# Patient Record
Sex: Male | Born: 1990 | Race: White | Hispanic: No | Marital: Single | Smoking: Former smoker
Health system: Southern US, Community
[De-identification: ages and names within clinical notes are randomized; demographics above are authoritative.]

## PROBLEM LIST (undated history)

## (undated) DIAGNOSIS — F1910 Other psychoactive substance abuse, uncomplicated: Secondary | ICD-10-CM

## (undated) HISTORY — DX: Other psychoactive substance abuse, uncomplicated: F19.10

---
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
## 2012-11-05 ENCOUNTER — Emergency Department (HOSPITAL_BASED_OUTPATIENT_CLINIC_OR_DEPARTMENT_OTHER): Payer: 59

## 2012-11-05 ENCOUNTER — Other Ambulatory Visit: Payer: Self-pay

## 2012-11-05 ENCOUNTER — Emergency Department (HOSPITAL_BASED_OUTPATIENT_CLINIC_OR_DEPARTMENT_OTHER)
Admission: EM | Admit: 2012-11-05 | Discharge: 2012-11-05 | Disposition: A | Discharge status: Home / Self Care | Payer: 59 | Attending: Emergency Medicine | Admitting: Emergency Medicine

## 2012-11-05 ENCOUNTER — Encounter (HOSPITAL_BASED_OUTPATIENT_CLINIC_OR_DEPARTMENT_OTHER): Payer: Self-pay | Admitting: *Deleted

## 2012-11-05 DIAGNOSIS — R5381 Other malaise: Secondary | ICD-10-CM | POA: Insufficient documentation

## 2012-11-05 DIAGNOSIS — R5383 Other fatigue: Secondary | ICD-10-CM

## 2012-11-05 DIAGNOSIS — F17200 Nicotine dependence, unspecified, uncomplicated: Secondary | ICD-10-CM | POA: Insufficient documentation

## 2012-11-05 DIAGNOSIS — R55 Syncope and collapse: Secondary | ICD-10-CM

## 2012-11-05 DIAGNOSIS — R630 Anorexia: Secondary | ICD-10-CM | POA: Insufficient documentation

## 2012-11-05 DIAGNOSIS — R0789 Other chest pain: Secondary | ICD-10-CM | POA: Insufficient documentation

## 2012-11-05 DIAGNOSIS — R209 Unspecified disturbances of skin sensation: Secondary | ICD-10-CM | POA: Insufficient documentation

## 2012-11-05 LAB — COMPREHENSIVE METABOLIC PANEL
ALT: 10 U/L (ref 0–53)
AST: 23 U/L (ref 0–37)
Alkaline Phosphatase: 80 U/L (ref 39–117)
CO2: 29 mEq/L (ref 19–32)
Calcium: 9.7 mg/dL (ref 8.4–10.5)
Chloride: 100 mEq/L (ref 96–112)
GFR calc Af Amer: >90 mL/min (ref >90)
GFR calc non Af Amer: 85 mL/min — ABNORMAL LOW (ref >90)
Glucose, Bld: 181 mg/dL — ABNORMAL HIGH (ref 70–99)
Potassium: 4 mEq/L (ref 3.5–5.1)
Sodium: 138 mEq/L (ref 135–145)
Total Bilirubin: 1 mg/dL (ref 0.3–1.2)

## 2012-11-05 LAB — CBC WITH DIFFERENTIAL/PLATELET
Basophils Absolute: 0 10*3/uL (ref 0.0–0.1)
Eosinophils Relative: 2 % (ref 0–5)
Lymphocytes Relative: 27 % (ref 12–46)
Lymphs Abs: 1.3 10*3/uL (ref 0.7–4.0)
MCV: 88.9 fL (ref 78.0–100.0)
Neutro Abs: 2.9 10*3/uL (ref 1.7–7.7)
Platelets: 133 10*3/uL — ABNORMAL LOW (ref 150–400)
RBC: 4.7 MIL/uL (ref 4.22–5.81)
WBC: 4.8 10*3/uL (ref 4.0–10.5)

## 2012-11-05 LAB — URINALYSIS, ROUTINE W REFLEX MICROSCOPIC
Bilirubin Urine: NEGATIVE
Hgb urine dipstick: NEGATIVE
Ketones, ur: NEGATIVE mg/dL
Nitrite: NEGATIVE
Protein, ur: NEGATIVE mg/dL
Urobilinogen, UA: 0.2 mg/dL (ref 0.0–1.0)

## 2012-11-05 LAB — RAPID URINE DRUG SCREEN, HOSP PERFORMED
Amphetamines: NOT DETECTED
Barbiturates: NOT DETECTED

## 2012-11-05 NOTE — ED Notes (Signed)
Pt reports intermittent chest pain for a few months. Episodes are sharp across chest and last anywhere from 56min-couple of hours. Pt recently discharged from [facility] for drug use. Pt went to work today, became very pale and diaphoretic. No LOC. States has had near syncopal episodes for past month. Father reports pt has been sleeping a lot, not taking anti-depression medication, not eating. Pt reports feeling generally weak and tired.

## 2012-11-05 NOTE — ED Provider Notes (Signed)
CSN: [id number]     Arrival date & time 11/05/12  1750 History   None    Chief Complaint  Patient presents with  . Near Syncope  . Chest Pain   (Consider location/radiation/quality/duration/timing/severity/associated sxs/prior Treatment) Patient is a 22 y.o. male presenting with weakness. The history is provided by the patient. No language interpreter was used.  Weakness This is a new problem. The current episode started more than 1 month ago. The problem has been gradually worsening. Associated symptoms include anorexia, fatigue and weakness. Pertinent negatives include no abdominal pain. Nothing aggravates the symptoms. He has tried nothing for the symptoms.  Pt reports he began feeling weak at work today. Pt reports he has been feeling bad for the past week.  Pt was in rehab in July.  Pt reports no current substance abuse.  Pt recently started on a antidepressant.    History reviewed. No pertinent past medical history. History reviewed. No pertinent past surgical history. History reviewed. No pertinent family history. History  Substance Use Topics  . Smoking status: Current Every Day Smoker -- 0.50 packs/day    Types: Cigarettes  . Smokeless tobacco: Not on file  . Alcohol Use: No    Review of Systems  Unable to perform ROS Constitutional: Positive for fatigue.  Gastrointestinal: Positive for anorexia. Negative for abdominal pain.  Neurological: Positive for weakness.  All other systems reviewed and are negative.    Allergies  Review of patient's allergies indicates no known allergies.  Home Medications  No current outpatient prescriptions on file. BP 121/70  Temp(Src) 97.9 F (36.6 C)  Resp 16  SpO2 98% Physical Exam  Nursing note and vitals reviewed. Constitutional: He appears well-developed and well-nourished.  HENT:  Head: Normocephalic.  Right Ear: External ear normal.  Left Ear: External ear normal.  Mouth/Throat: Oropharynx is clear and moist.  Eyes:  Pupils are equal, round, and reactive to light.  Neck: Normal range of motion. Neck supple.  Cardiovascular: Normal rate and regular rhythm.   Pulmonary/Chest: Effort normal and breath sounds normal.  Abdominal: Soft. Bowel sounds are normal.  Musculoskeletal: Normal range of motion.  Neurological: He is alert.  Skin: Skin is warm.  Psychiatric: He has a normal mood and affect.    ED Course  Procedures (including critical care time) Labs Review Labs Reviewed - No data to display Imaging Review No results found.  MDM   1. Fatigue   2. Near syncope     Date: 11/06/2012  Rate: 66  Rhythm: sinus arrhythmia  QRS Axis: normal  Intervals: normal  ST/T Wave abnormalities: normal  Conduction Disutrbances:none  Narrative Interpretation:   Old EKG Reviewed: none available Labs reviewed with pt,   Elevated glucose and low platelets,  Pt advised to see his Md for recheck this week.   Pt encouraged to take antidepressant,  Eat and drink normally.   Pt advised he may need further testing with his Md   Elson Areas, PA-C 11/06/12 0006  Lonia Skinner [place], [place] 11/06/12 0006

## 2012-11-05 NOTE — ED Notes (Signed)
D/c home with family- no Rx given- ambulatory with steady gait

## 2012-11-05 NOTE — ED Notes (Signed)
EDPa Sofia at bedside.

## 2012-11-06 NOTE — ED Provider Notes (Signed)
Medical screening examination/treatment/procedure(s) were performed by non-physician practitioner and as supervising physician I was immediately available for consultation/collaboration.  Layla Maw Josua Ferrebee, DO 11/06/12 [phone]

## 2013-01-11 ENCOUNTER — Ambulatory Visit (INDEPENDENT_AMBULATORY_CARE_PROVIDER_SITE_OTHER): Payer: 59 | Admitting: Physician Assistant

## 2013-01-11 VITALS — BP 102/80 | HR 82 | Temp 98.2°F | Resp 18 | Ht 72.0 in | Wt 164.0 lb

## 2013-01-11 DIAGNOSIS — J069 Acute upper respiratory infection, unspecified: Secondary | ICD-10-CM

## 2013-01-11 MED ORDER — GUAIFENESIN ER 1200 MG PO TB12: 1.0000 | ORAL_TABLET | Freq: Two times a day (BID) | ORAL | Status: DC | PRN

## 2013-01-11 MED ORDER — IPRATROPIUM BROMIDE 0.03 % NA SOLN: 2.0000 | Freq: Two times a day (BID) | NASAL | Status: DC

## 2013-01-11 MED ORDER — BENZONATATE 100 MG PO CAPS: 100.0000 mg | ORAL_CAPSULE | Freq: Three times a day (TID) | ORAL | Status: DC | PRN

## 2013-01-11 NOTE — Patient Instructions (Signed)
Get plenty of rest and drink at least 64 ounces of water daily. 

## 2013-01-11 NOTE — Progress Notes (Signed)
  Subjective:    Patient ID: Philip Spencer, male    DOB: Dec 08, 1990, 22 y.o.   MRN: [id number]  HPI  This 22 y.o. male presents for evaluation of respiratory illness. 01/11/2013 in the afternoon, developed achey throat.  Congestion, coughing, post-nasal drainage. Gets bronchitis frequently, has to be seen about every 12-24 months, often needing a breathing treatment.  Subjective fever/chills.  No vomiting or diarrhea, but some nausea.  No ear pain, but "throbbing" fullness.  Sneezing.  Medications, allergies, past medical history, surgical history, family history, social history and problem list reviewed.   Review of Systems As above.    Objective:   Physical Exam  Blood pressure 102/80, pulse 82, temperature 98.2 F (36.8 C), temperature source Oral, resp. rate 18, height 6' (1.829 m), weight 164 lb (74.39 kg), SpO2 97.00%. Body mass index is 22.24 kg/(m^2). Well-developed, well nourished WM who is awake, alert and oriented, in NAD. HEENT: [place]/AT, PERRL, EOMI.  Sclera and conjunctiva are clear.  EAC are patent, TMs are normal in appearance. Nasal mucosa is congested, pink and moist. OP is clear. Neck: supple, non-tender, no lymphadenopathy, thyromegaly. Heart: RRR, no murmur Lungs: normal effort, CTA Extremities: no cyanosis, clubbing or edema. Skin: warm and dry without rash. Psychologic: good mood and appropriate affect, normal speech and behavior.       Assessment & Plan:  Viral URI with cough - Plan: ipratropium (ATROVENT) 0.03 % nasal spray, Guaifenesin (MUCINEX MAXIMUM STRENGTH) 1200 MG TB12, benzonatate (TESSALON) 100 MG capsule  Supportive care.  Anticipatory guidance.  RTC if symptoms worsen/persist.  Fernande Bras, PA-C Physician Assistant-Certified [facility]

## 2013-09-25 ENCOUNTER — Other Ambulatory Visit: Payer: Self-pay | Admitting: Gastroenterology

## 2013-09-25 DIAGNOSIS — R1013 Epigastric pain: Secondary | ICD-10-CM

## 2013-10-03 ENCOUNTER — Ambulatory Visit
Admission: RE | Admit: 2013-10-03 | Discharge: 2013-10-03 | Disposition: A | Discharge status: Home / Self Care | Payer: 59 | Source: Ambulatory Visit | Attending: Gastroenterology | Admitting: Gastroenterology

## 2013-10-03 DIAGNOSIS — R1013 Epigastric pain: Secondary | ICD-10-CM

## 2016-11-28 ENCOUNTER — Encounter (HOSPITAL_COMMUNITY): Payer: Self-pay | Admitting: *Deleted

## 2016-11-28 ENCOUNTER — Ambulatory Visit (HOSPITAL_COMMUNITY)
Admission: EM | Admit: 2016-11-28 | Discharge: 2016-11-28 | Disposition: A | Discharge status: Home / Self Care | Payer: 59 | Attending: Internal Medicine | Admitting: Internal Medicine

## 2016-11-28 DIAGNOSIS — H6693 Otitis media, unspecified, bilateral: Secondary | ICD-10-CM | POA: Diagnosis not present

## 2016-11-28 MED ORDER — AMOXICILLIN-POT CLAVULANATE 875-125 MG PO TABS: 1.0000 | ORAL_TABLET | Freq: Two times a day (BID) | ORAL | 0 refills | Status: AC

## 2016-11-28 NOTE — Discharge Instructions (Addendum)
Anticipate gradual improvement in well being, headache, and head congestion over the next several days.  Prescription for augmentin (antibiotic) was sent to the pharmacy.  Recheck for new fever >100.5, increasing phlegm production/nasal discharge, or if not starting to improve in a few days.   Push fluids and rest.

## 2016-11-28 NOTE — ED Triage Notes (Signed)
C/O "head pressure" over past week with slight throat irritation.  States feels like head congestion is getting worse.

## 2016-11-28 NOTE — ED Provider Notes (Signed)
[facility]    CSN: [id number] Arrival date & time: 11/28/16  1907     History   Chief Complaint Chief Complaint  Patient presents with  . Headache  . Sore Throat    HPI Philip Spencer is a 26 y.o. male. He presents today with several days history of increasing headache, head congestion, very bad sore throat. Some postnasal drainage, not much cough. No stomach upset. Malaise, but not really achy.    HPI  Past Medical History:  Diagnosis Date  . Substance abuse    clean/sober since 08/24/2012    History reviewed. No pertinent surgical history.     Home Medications    Prior to Admission medications   Medication Sig Start Date End Date Taking? Authorizing Provider  amoxicillin-clavulanate (AUGMENTIN) 875-125 MG tablet Take 1 tablet by mouth every 12 (twelve) hours. 11/28/16 12/08/16  Eustace Moore, MD    Family History No family history on file.  Social History Social History  Substance Use Topics  . Smoking status: Current Every Day Smoker    Packs/day: 0.50    Types: Cigarettes  . Smokeless tobacco: Never Used  . Alcohol use Yes     Comment: occasionally     Allergies   Patient has no known allergies.   Review of Systems Review of Systems  All other systems reviewed and are negative.    Physical Exam Triage Vital Signs ED Triage Vitals  Enc Vitals Group     BP 11/28/16 1952 (!) 153/96     Pulse Rate 11/28/16 1952 86     Resp 11/28/16 1952 16     Temp 11/28/16 1952 98.8 F (37.1 C)     Temp Source 11/28/16 1952 Oral     SpO2 11/28/16 1952 99 %     Weight --      Height --      Pain Score 11/28/16 1953 6     Pain Loc --    Updated Vital Signs BP (!) 153/96   Pulse 86   Temp 98.8 F (37.1 C) (Oral)   Resp 16   SpO2 99%  Physical Exam  Constitutional: He is oriented to person, place, and time.  Alert, nicely groomed Voice sounds quite congested  HENT:  Head: Atraumatic.  Bilateral TMs are dull and red Moderate nasal  congestion bilaterally Posterior pharynx is red  Eyes:  Conjugate gaze, no eye redness/drainage  Neck: Neck supple.  Cardiovascular: Normal rate and regular rhythm.   Pulmonary/Chest: No respiratory distress. He has no wheezes. He has no rales.  Lungs clear, symmetric breath sounds  Abdominal: He exhibits no distension.  Musculoskeletal: Normal range of motion.  Neurological: He is alert and oriented to person, place, and time.  Skin: Skin is warm and dry.  No cyanosis  Nursing note and vitals reviewed.    UC Treatments / Results   Procedures Procedures (including critical care time) None today  Final Clinical Impressions(s) / UC Diagnoses   Final diagnoses:  Acute bilateral otitis media   Anticipate gradual improvement in well being, headache, and head congestion over the next several days.  Prescription for augmentin (antibiotic) was sent to the pharmacy.  Recheck for new fever >100.5, increasing phlegm production/nasal discharge, or if not starting to improve in a few days.   Push fluids and rest.  New Prescriptions New Prescriptions   AMOXICILLIN-CLAVULANATE (AUGMENTIN) 875-125 MG TABLET    Take 1 tablet by mouth every 12 (twelve) hours.  Controlled Substance Prescriptions [place] Controlled Substance Registry consulted? No   Eustace Moore, MD 11/29/16 [phone]

## 2017-01-04 ENCOUNTER — Encounter (HOSPITAL_COMMUNITY): Payer: Self-pay | Admitting: Emergency Medicine

## 2017-01-04 ENCOUNTER — Ambulatory Visit (HOSPITAL_COMMUNITY)
Admission: EM | Admit: 2017-01-04 | Discharge: 2017-01-04 | Disposition: A | Discharge status: Home / Self Care | Payer: 59 | Attending: Family Medicine | Admitting: Family Medicine

## 2017-01-04 DIAGNOSIS — L259 Unspecified contact dermatitis, unspecified cause: Secondary | ICD-10-CM | POA: Diagnosis not present

## 2017-01-04 MED ORDER — TRIAMCINOLONE ACETONIDE 0.1 % EX CREA: 1.0000 "application " | TOPICAL_CREAM | Freq: Two times a day (BID) | CUTANEOUS | 0 refills | Status: DC

## 2017-01-04 NOTE — ED Provider Notes (Addendum)
[facility]    CSN: [id number] Arrival date & time: 01/04/17  1810     History   Chief Complaint Chief Complaint  Patient presents with  . Rash    HPI Philip Spencer is a 26 y.o. male.   Philip Spencer presents with complaints of recurring rash. Original rash was three weeks ago to posterior shoulder blade, two weeks ago to left clavicle and two days ago to left neck and right hip. The previous rashes have improved on their own without treatment. Mildly itchy. Without pain. No known exposures, new products, new foods or known allergens. No new medications. Does not take any medications. Has not tried any treatments for symptoms. Never with any pustules, open lesions, draining, scales or flaking of lesions. Denies URI symptoms.   ROS per HPI.       Past Medical History:  Diagnosis Date  . Substance abuse (HCC)    clean/sober since 08/24/2012    There are no active problems to display for this patient.   History reviewed. No pertinent surgical history.     Home Medications    Prior to Admission medications   Medication Sig Start Date End Date Taking? Authorizing Provider  triamcinolone cream (KENALOG) 0.1 % Apply 1 application topically 2 (two) times daily. 01/04/17   Philip Spencer, Philip Spencer B, NP    Family History No family history on file.  Social History Social History  Substance Use Topics  . Smoking status: Current Every Day Smoker    Packs/day: 0.50    Types: Cigarettes  . Smokeless tobacco: Never Used  . Alcohol use Yes     Comment: occasionally     Allergies   Patient has no known allergies.   Review of Systems Review of Systems   Physical Exam Triage Vital Signs ED Triage Vitals  Enc Vitals Group     BP 01/04/17 1817 (!) 146/94     Pulse Rate 01/04/17 1817 91     Resp 01/04/17 1817 16     Temp 01/04/17 1817 98.2 F (36.8 C)     Temp src --      SpO2 01/04/17 1817 100 %     Weight --      Height --      Head Circumference --      Peak  Flow --      Pain Score 01/04/17 1818 0     Pain Loc --      Pain Edu? --      Excl. in GC? --    No data found.   Updated Vital Signs BP (!) 146/94   Pulse 91   Temp 98.2 F (36.8 C)   Resp 16   SpO2 100%   Visual Acuity Right Eye Distance:   Left Eye Distance:   Bilateral Distance:    Right Eye Near:   Left Eye Near:    Bilateral Near:     Physical Exam  Constitutional: He is oriented to person, place, and time. He appears well-developed and well-nourished.  Cardiovascular: Normal rate and regular rhythm.   Pulmonary/Chest: Effort normal and breath sounds normal.  Neurological: He is alert and oriented to person, place, and time.  Skin: Skin is warm and dry. Rash noted. Rash is urticarial.     Red raised hives to left neck and to right hip; without vesicle, erythema or open skin       UC Treatments / Results  Labs (all labs ordered are listed, but only abnormal  results are displayed) Labs Reviewed - No data to display  EKG  EKG Interpretation None       Radiology No results found.  Procedures Procedures (including critical care time)  Medications Ordered in UC Medications - No data to display   Initial Impression / Assessment and Plan / UC Course  I have reviewed the triage vital signs and the nursing notes.  Pertinent labs & imaging results that were available during my care of the patient were reviewed by me and considered in my medical decision making (see chart for details).     Rash consistent with hives, likely allergic in nature. Recommended trial of kenalog topically as well as oral antihistamine. Follow up with dermatology and/or allergy clinic as needed if symptoms persist or do not improve. Discussed signs to return sooner. Patient verbalized understanding and agreeable to plan.   Philip Haber, NP 01/04/2017 6:47 PM   Final Clinical Impressions(s) / UC Diagnoses   Final diagnoses:  Contact dermatitis, unspecified contact  dermatitis type, unspecified trigger    New Prescriptions Discharge Medication List as of 01/04/2017  6:37 PM    START taking these medications   Details  triamcinolone cream (KENALOG) 0.1 % Apply 1 application topically 2 (two) times daily., Starting Tue 01/04/2017, Normal         Controlled Substance Prescriptions [place] Controlled Substance Registry consulted? Not Applicable   Philip Haber, NP 01/04/17 1847    Philip Haber, NP 01/07/17 [phone]

## 2017-01-04 NOTE — ED Triage Notes (Signed)
Pt c/o Rash on L side of neck, since two days. Also rash on R hip.

## 2017-01-04 NOTE — Pre-Procedure Instructions (Signed)
na

## 2018-11-09 ENCOUNTER — Other Ambulatory Visit: Payer: Self-pay

## 2018-11-09 DIAGNOSIS — Z20822 Contact with and (suspected) exposure to covid-19: Secondary | ICD-10-CM

## 2018-11-10 LAB — NOVEL CORONAVIRUS, NAA: SARS-CoV-2, NAA: NOT DETECTED

## 2019-09-17 ENCOUNTER — Other Ambulatory Visit: Payer: Self-pay

## 2019-09-17 ENCOUNTER — Ambulatory Visit (INDEPENDENT_AMBULATORY_CARE_PROVIDER_SITE_OTHER): Payer: 59

## 2019-09-17 ENCOUNTER — Ambulatory Visit (INDEPENDENT_AMBULATORY_CARE_PROVIDER_SITE_OTHER): Payer: 59 | Admitting: Family Medicine

## 2019-09-17 ENCOUNTER — Encounter: Payer: Self-pay | Admitting: Family Medicine

## 2019-09-17 VITALS — BP 136/80 | HR 67 | Temp 98.4°F | Ht 72.0 in | Wt 173.2 lb

## 2019-09-17 DIAGNOSIS — Z87891 Personal history of nicotine dependence: Secondary | ICD-10-CM

## 2019-09-17 DIAGNOSIS — F418 Other specified anxiety disorders: Secondary | ICD-10-CM

## 2019-09-17 DIAGNOSIS — R079 Chest pain, unspecified: Secondary | ICD-10-CM

## 2019-09-17 DIAGNOSIS — L989 Disorder of the skin and subcutaneous tissue, unspecified: Secondary | ICD-10-CM | POA: Diagnosis not present

## 2019-09-17 DIAGNOSIS — R12 Heartburn: Secondary | ICD-10-CM

## 2019-09-17 NOTE — Patient Instructions (Addendum)
Good work with quitting smoking! See helpful hints below.  See foods to avoid with heartburn. pepcid once per day for now ok.  I will let you know if concerns on labs, x-ray or EKG.  Recheck 1 month.  See information below on chest pain, but not concerning based on history and exam today.  I would consider meeting with therapist about mood symptoms. Here are a few options for counseling:  [facility]:  [phone]  [facility] [phone]   Food Choices for Gastroesophageal Reflux Disease, Adult When you have gastroesophageal reflux disease (GERD), the foods you eat and your eating habits are very important. Choosing the right foods can help ease the discomfort of GERD. Consider working with a diet and nutrition specialist (dietitian) to help you make healthy food choices. What general guidelines should I follow?  Eating plan  Choose healthy foods low in fat, such as fruits, vegetables, whole grains, low-fat dairy products, and lean meat, fish, and poultry.  Eat frequent, small meals instead of three large meals each day. Eat your meals slowly, in a relaxed setting. Avoid bending over or lying down until 2-3 hours after eating.  Limit high-fat foods such as fatty meats or fried foods.  Limit your intake of oils, butter, and shortening to less than 8 teaspoons each day.  Avoid the following: ? Foods that cause symptoms. These may be different for different people. Keep a food diary to keep track of foods that cause symptoms. ? Alcohol. ? Drinking large amounts of liquid with meals. ? Eating meals during the 2-3 hours before bed.  Cook foods using methods other than frying. This may include baking, grilling, or broiling. Lifestyle  Maintain a healthy weight. Ask your health care provider what weight is healthy for you. If you need to lose weight, work with your health care provider to do so safely.  Exercise for at least 30 minutes on 5  or more days each week, or as told by your health care provider.  Avoid wearing clothes that fit tightly around your waist and chest.  Do not use any products that contain nicotine or tobacco, such as cigarettes and e-cigarettes. If you need help quitting, ask your health care provider.  Sleep with the head of your bed raised. Use a wedge under the mattress or blocks under the bed frame to raise the head of the bed. What foods are not recommended? The items listed may not be a complete list. Talk with your dietitian about what dietary choices are best for you. Grains Pastries or quick breads with added fat. [place] toast. Vegetables Deep fried vegetables. [place] fries. Any vegetables prepared with added fat. Any vegetables that cause symptoms. For some people this may include tomatoes and tomato products, chili peppers, onions and garlic, and horseradish. Fruits Any fruits prepared with added fat. Any fruits that cause symptoms. For some people this may include citrus fruits, such as oranges, grapefruit, pineapple, and lemons. Meats and other protein foods High-fat meats, such as fatty beef or pork, hot dogs, ribs, ham, sausage, salami and bacon. Fried meat or protein, including fried fish and fried chicken. Nuts and nut butters. Dairy Whole milk and chocolate milk. Sour cream. Cream. Ice cream. Cream cheese. Milk shakes. Beverages Coffee and tea, with or without caffeine. Carbonated beverages. Sodas. Energy drinks. Fruit juice made with acidic fruits (such as orange or grapefruit). Tomato juice. Alcoholic drinks. Fats and oils Butter. Margarine. Shortening. Ghee. Sweets and desserts Chocolate and cocoa. Donuts.  Seasoning and other foods Pepper. Peppermint and spearmint. Any condiments, herbs, or seasonings that cause symptoms. For some people, this may include curry, hot sauce, or vinegar-based salad dressings. Summary  When you have gastroesophageal reflux disease (GERD), food and  lifestyle choices are very important to help ease the discomfort of GERD.  Eat frequent, small meals instead of three large meals each day. Eat your meals slowly, in a relaxed setting. Avoid bending over or lying down until 2-3 hours after eating.  Limit high-fat foods such as fatty meat or fried foods. This information is not intended to replace advice given to you by your health care provider. Make sure you discuss any questions you have with your health care provider. Document Revised: 06/15/2018 Document Reviewed: 02/24/2016 Elsevier Patient Education  2020 [employer]   Nonspecific Chest Pain, Adult Chest pain can be caused by many different conditions. It can be caused by a condition that is life-threatening and requires treatment right away. It can also be caused by something that is not life-threatening. If you have chest pain, it can be hard to know the difference, so it is important to get help right away to make sure that you do not have a serious condition. Some life-threatening causes of chest pain include:  Heart attack.  A tear in the body's main blood vessel (aortic dissection).  Inflammation around your heart (pericarditis).  A problem in the lungs, such as a blood clot (pulmonary embolism) or a collapsed lung (pneumothorax). Some non life-threatening causes of chest pain include:  Heartburn.  Anxiety or stress.  Damage to the bones, muscles, and cartilage that make up your chest wall.  Pneumonia or bronchitis.  Shingles infection (varicella-zoster virus). Chest pain can feel like:  Pain or discomfort on the surface of your chest or deep in your chest.  Crushing, pressure, aching, or squeezing pain.  Burning or tingling.  Dull or sharp pain that is worse when you move, cough, or take a deep breath.  Pain or discomfort that is also felt in your back, neck, jaw, shoulder, or arm, or pain that spreads to any of these areas. Your chest pain may come and go.  It may also be constant. Your health care provider will do lab tests and other studies to find the cause of your pain. Treatment will depend on the cause of your chest pain. Follow these instructions at home: Medicines  Take over-the-counter and prescription medicines only as told by your health care provider.  If you were prescribed an antibiotic, take it as told by your health care provider. Do not stop taking the antibiotic even if you start to feel better. Lifestyle   Rest as directed by your health care provider.  Do not use any products that contain nicotine or tobacco, such as cigarettes and e-cigarettes. If you need help quitting, ask your health care provider.  Do not drink alcohol.  Make healthy lifestyle choices as recommended. These may include: ? Getting regular exercise. Ask your health care provider to suggest some activities that are safe for you. ? Eating a heart-healthy diet. This includes plenty of fresh fruits and vegetables, whole grains, low-fat (lean) protein, and low-fat dairy products. A dietitian can help you find healthy eating options. ? Maintaining a healthy weight. ? Managing any other health conditions you have, such as high blood pressure (hypertension) or diabetes. ? Reducing stress, such as with yoga or relaxation techniques. General instructions  Pay attention to any changes in your symptoms. Tell  your health care provider about them or any new symptoms.  Avoid any activities that cause chest pain.  Keep all follow-up visits as told by your health care provider. This is important. This includes visits for any further testing if your chest pain does not go away. Contact a health care provider if:  Your chest pain does not go away.  You feel depressed.  You have a fever. Get help right away if:  Your chest pain gets worse.  You have a cough that gets worse, or you cough up blood.  You have severe pain in your abdomen.  You faint.  You  have sudden, unexplained chest discomfort.  You have sudden, unexplained discomfort in your arms, back, neck, or jaw.  You have shortness of breath at any time.  You suddenly start to sweat, or your skin gets clammy.  You feel nausea or you vomit.  You suddenly feel lightheaded or dizzy.  You have severe weakness, or unexplained weakness or fatigue.  Your heart begins to beat quickly, or it feels like it is skipping beats. These symptoms may represent a serious problem that is an emergency. Do not wait to see if the symptoms will go away. Get medical help right away. Call your local emergency services (911 in the [place] Do not drive yourself to the hospital. Summary  Chest pain can be caused by a condition that is serious and requires urgent treatment. It may also be caused by something that is not life-threatening.  If you have chest pain, it is very important to see your health care provider. Your health care provider may do lab tests and other studies to find the cause of your pain.  Follow your health care provider's instructions on taking medicines, making lifestyle changes, and getting emergency treatment if symptoms become worse.  Keep all follow-up visits as told by your health care provider. This includes visits for any further testing if your chest pain does not go away. This information is not intended to replace advice given to you by your health care provider. Make sure you discuss any questions you have with your health care provider. Document Revised: 08/25/2017 Document Reviewed: 08/25/2017 Elsevier Patient Education  2020 [employer].   Coping with Quitting Smoking  Quitting smoking is a physical and mental challenge. You will face cravings, withdrawal symptoms, and temptation. Before quitting, work with your health care provider to make a plan that can help you cope. Preparation can help you quit and keep you from giving in. How can I cope with cravings? Cravings  usually last for 5-10 minutes. If you get through it, the craving will pass. Consider taking the following actions to help you cope with cravings:  Keep your mouth busy: ? Chew sugar-free gum. ? Suck on hard candies or a straw. ? Brush your teeth.  Keep your hands and body busy: ? Immediately change to a different activity when you feel a craving. ? Squeeze or play with a ball. ? Do an activity or a hobby, like making bead jewelry, practicing needlepoint, or working with wood. ? Mix up your normal routine. ? Take a short exercise break. Go for a quick walk or run up and down stairs. ? Spend time in public places where smoking is not allowed.  Focus on doing something kind or helpful for someone else.  Call a friend or family member to talk during a craving.  Join a support group.  Call a quit line, such as 1-800-QUIT-NOW.  Talk with your health care provider about medicines that might help you cope with cravings and make quitting easier for you. How can I deal with withdrawal symptoms? Your body may experience negative effects as it tries to get used to not having nicotine in the system. These effects are called withdrawal symptoms. They may include:  Feeling hungrier than normal.  Trouble concentrating.  Irritability.  Trouble sleeping.  Feeling depressed.  Restlessness and agitation.  Craving a cigarette. To manage withdrawal symptoms:  Avoid places, people, and activities that trigger your cravings.  Remember why you want to quit.  Get plenty of sleep.  Avoid coffee and other caffeinated drinks. These may worsen some of your symptoms. How can I handle social situations? Social situations can be difficult when you are quitting smoking, especially in the first few weeks. To manage this, you can:  Avoid parties, bars, and other social situations where people might be smoking.  Avoid alcohol.  Leave right away if you have the urge to smoke.  Explain to your  family and friends that you are quitting smoking. Ask for understanding and support.  Plan activities with friends or family where smoking is not an option. What are some ways I can cope with stress? Wanting to smoke may cause stress, and stress can make you want to smoke. Find ways to manage your stress. Relaxation techniques can help. For example:  Breathe slowly and deeply, in through your nose and out through your mouth.  Listen to soothing, relaxing music.  Talk with a family member or friend about your stress.  Light a candle.  Soak in a bath or take a shower.  Think about a peaceful place. What are some ways I can prevent weight gain? Be aware that many people gain weight after they quit smoking. However, not everyone does. To keep from gaining weight, have a plan in place before you quit and stick to the plan after you quit. Your plan should include:  Having healthy snacks. When you have a craving, it may help to: ? Eat plain popcorn, crunchy carrots, celery, or other cut vegetables. ? Chew sugar-free gum.  Changing how you eat: ? Eat small portion sizes at meals. ? Eat 4-6 small meals throughout the day instead of 1-2 large meals a day. ? Be mindful when you eat. Do not watch television or do other things that might distract you as you eat.  Exercising regularly: ? Make time to exercise each day. If you do not have time for a long workout, do short bouts of exercise for 5-10 minutes several times a day. ? Do some form of strengthening exercise, like weight lifting, and some form of aerobic exercise, like running or swimming.  Drinking plenty of water or other low-calorie or no-calorie drinks. Drink 6-8 glasses of water daily, or as much as instructed by your health care provider. Summary  Quitting smoking is a physical and mental challenge. You will face cravings, withdrawal symptoms, and temptation to smoke again. Preparation can help you as you go through these  challenges.  You can cope with cravings by keeping your mouth busy (such as by chewing gum), keeping your body and hands busy, and making calls to family, friends, or a helpline for people who want to quit smoking.  You can cope with withdrawal symptoms by avoiding places where people smoke, avoiding drinks with caffeine, and getting plenty of rest.  Ask your health care provider about the different ways to prevent weight gain,  avoid stress, and handle social situations. This information is not intended to replace advice given to you by your health care provider. Make sure you discuss any questions you have with your health care provider. Document Revised: 02/04/2017 Document Reviewed: 02/20/2016 Elsevier Patient Education  [employer].    If you have lab work done today you will be contacted with your lab results within the next 2 weeks.  If you have not heard from [place] then please contact us. The fastest way to get your results is to register for My Chart.   IF you received an x-ray today, you will receive an invoice from [facility]. Please contact [facility] at [phone] with questions or concerns regarding your invoice.   IF you received labwork today, you will receive an invoice from [place]. Please contact [facility] at [phone] with questions or concerns regarding your invoice.   Our billing staff will not be able to assist you with questions regarding bills from these companies.  You will be contacted with the lab results as soon as they are available. The fastest way to get your results is to activate your My Chart account. Instructions are located on the last page of this paperwork. If you have not heard from [place] regarding the results in 2 weeks, please contact this office.

## 2019-09-17 NOTE — Progress Notes (Signed)
Subjective:  Patient ID: Philip Spencer, male    DOB: 03-14-1990  Age: 29 y.o. MRN: [id number]  CC:  Chief Complaint  Patient presents with  . Establish Care    possible CPE   . body acne    he sees Derm     HPI Philip Spencer presents for   New patient - here to establish care.   Tobacco use:  Quit 04/27/19. 10 year hx, less than pack per day - 12 cigs per day. Used Chantix for 28month. Doing well. No vaping.  Marijuana few days per week. Joints only.  Cocaine - 2016-2019. No injectables, no recent use.  S/p rehab for benzos in early 20's, polypharmacy.  Some bouts of depression at times in the past. No meds - met with therapist for 6 months after rehab in 2014. No recent therapy.  Situational depression and anxiety only. Has improved after stopping prior drug use.  Stress improved has improved since stopped smoking.   [facility]  Decreased Interest 0  Down, Depressed, Hopeless 0  PHQ - 2 Score 0  No flowsheet data found.   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 Insect bite on left improving.  No fevers. No other rash.   Chest pain: On and off past 4-5 years.  Some heartburn at times- sometimes with other pains - 2-3 times per week.  Occasional pain inside chest (about once per day - catching feeling, up to 5 seconds, sometimes dull or sharp pain. pressure feeling in lungs at times, both noted with marijuana but sometimes random.  No chest pain with exertion.  No palpitations. No immediate family members with heart disease.  Hx of pectus excavatum. Sore at times with sleeping certain way. No dyspnea.   Some increased hours at work -[employer] Some lifting.   Tx: tums 3-4 times per week. Helps other chest symptoms.   Acne: [place] [facility] [employer]  Skin cleared with Retin A and Benzaclin.   History There are no problems to display for this patient.  Past Medical History:    Diagnosis Date  . Substance abuse ([facility] [place]    clean/sober since 08/24/2012   No past surgical history on file. No Known Allergies Prior to Admission medications   Medication Sig Start Date End Date Taking? Authorizing Provider  clindamycin-benzoyl peroxide (BENZACLIN) gel Apply 1 application topically every other day. 07/05/19  Yes [provider]  tretinoin (RETIN-A) 0.05 % cream Apply topically as directed. 07/05/19  Yes [provider]   Social History   Socioeconomic History  . Marital status: Single    Spouse name: n/a  . Number of children: 0  . Years of education: Not on file  . Highest education level: Not on file  Occupational History  . Occupation: sPress photographer+    Comment: [employer]  Tobacco Use  . Smoking status: Former Smoker    Packs/day: 0.50    Types: Cigarettes    Quit date: 04/13/2019    Years since quitting: 0.4  . Smokeless tobacco: Never Used  Substance and Sexual Activity  . Alcohol use: Yes    Comment: occasionally  . Drug use: No    Types: Benzodiazepines, Marijuana, Other-see comments    Comment: Clean since 68/25/0539(psychedelics, opiods)  . Sexual activity: Not on file  Other Topics Concern  . Not on file  Social History Narrative   Lives with his parents.  Social Determinants of Health   Financial Resource Strain:   . Difficulty of Paying Living Expenses:   Food Insecurity:   . Worried About Charity fundraiser in the Last Year:   . Arboriculturist in the Last Year:   Transportation Needs:   . Film/video editor (Medical):   Marland Kitchen Lack of Transportation (Non-Medical):   Physical Activity:   . Days of Exercise per Week:   . Minutes of Exercise per Session:   Stress:   . Feeling of Stress :   Social Connections:   . Frequency of Communication with Friends and Family:   . Frequency of Social Gatherings with Friends and Family:   . Attends Religious Services:   . Active Member of Clubs or Organizations:   . Attends English as a second language teacher Meetings:   Marland Kitchen Marital Status:   Intimate Partner Violence:   . Fear of Current or Ex-Partner:   . Emotionally Abused:   Marland Kitchen Physically Abused:   . Sexually Abused:     Review of Systems Per hpi  Objective:   Vitals:   09/17/19 1516 09/17/19 1524  BP: (!) 164/93 136/80  Pulse: 67   Temp: 98.4 F (36.9 C)   TempSrc: Temporal   SpO2: 97%   Weight: 173 lb 3.2 oz (78.6 kg)   Height: 6' (1.829 m)      Physical Exam Vitals reviewed.  Constitutional:      Appearance: He is well-developed.  HENT:     Head: Normocephalic and atraumatic.  Eyes:     Pupils: Pupils are equal, round, and reactive to light.  Neck:     Vascular: No carotid bruit or JVD.  Cardiovascular:     Rate and Rhythm: Normal rate and regular rhythm.     Heart sounds: Normal heart sounds. No murmur heard.   Pulmonary:     Effort: Pulmonary effort is normal.     Breath sounds: Normal breath sounds. No rales.  Skin:    General: Skin is warm and dry.  Neurological:     Mental Status: He is alert and oriented to person, place, and time.        92mn visit - greater than 50% counseling.   DG Chest 2 View  Result Date: 09/17/2019 CLINICAL DATA:  Intermittent chest pain EXAM: CHEST - 2 VIEW COMPARISON:  2014 FINDINGS: The heart size and mediastinal contours are within normal limits. Both lungs are clear. No pleural effusion or pneumothorax. Pectus excavatum. IMPRESSION: No acute process in the chest. Electronically Signed   By: PMacy MisM.D.   On: 09/17/2019 16:56   EKG sinus rhythm, some rate variation, RSR in V1, no apparent acute findings or significant changes since comparison EKG in September 2014  Assessment & Plan:  Philip R RMcmillenis a 29y.o. male . Chest pain, unspecified type - Plan: CBC, Sedimentation Rate, EKG 12-Lead, DG Chest 2 View  -Atypical, fleeting symptoms.  Potentially could be heartburn related, versus chest wall.  Some physical activity with work.  Chest x-ray  reassuring, no apparent acute or concerning findings on EKG.  Check sed rate, CBC, but less likely infectious/inflammatory.  -Handout given on chest pain.  Trial of Pepcid for possible heartburn and trigger foods discussed.  ER/RTC precautions, consider cardiology eval if persistent  Situational anxiety  -Suspect some possible underlying anxiety that has been overall improved recently.  Still would agree to meet with therapist, phone numbers provided.  Potentially did have worsened anxiety  with substance use in the past, only using marijuana at this time..  Commended on cessation of tobacco.     Heartburn  -Pepcid over-the-counter, avoidance of trigger foods, RTC precautions  Abnormality, skin  -Left-sided chest wall physiological, vessels, no underlying mass or skin findings appreciated.  Does have physical work, potentially contributing.  Consider dermatology eval.  History of tobacco use  -Commended on cessation, handout given on coping with quitting.  No orders of the defined types were placed in this encounter.  Patient Instructions   Good work with quitting smoking! See helpful hints below.  See foods to avoid with heartburn. pepcid once per day for now ok.   I would consider meeting with therapist about mood symptoms. Here are a few options for counseling:  [facility]:  [place] [phone]   Food Choices for Gastroesophageal Reflux Disease, Adult When you have gastroesophageal reflux disease (GERD), the foods you eat and your eating habits are very important. Choosing the right foods can help ease the discomfort of GERD. Consider working with a diet and nutrition specialist (dietitian) to help you make healthy food choices. What general guidelines should I follow?  Eating plan  Choose healthy foods low in fat, such as fruits, vegetables, whole grains, low-fat dairy products, and lean meat, fish, and  poultry.  Eat frequent, small meals instead of three large meals each day. Eat your meals slowly, in a relaxed setting. Avoid bending over or lying down until 2-3 hours after eating.  Limit high-fat foods such as fatty meats or fried foods.  Limit your intake of oils, butter, and shortening to less than 8 teaspoons each day.  Avoid the following: ? Foods that cause symptoms. These may be different for different people. Keep a food diary to keep track of foods that cause symptoms. ? Alcohol. ? Drinking large amounts of liquid with meals. ? Eating meals during the 2-3 hours before bed.  Cook foods using methods other than frying. This may include baking, grilling, or broiling. Lifestyle  Maintain a healthy weight. Ask your health care provider what weight is healthy for you. If you need to lose weight, work with your health care provider to do so safely.  Exercise for at least 30 minutes on 5 or more days each week, or as told by your health care provider.  Avoid wearing clothes that fit tightly around your waist and chest.  Do not use any products that contain nicotine or tobacco, such as cigarettes and e-cigarettes. If you need help quitting, ask your health care provider.  Sleep with the head of your bed raised. Use a wedge under the mattress or blocks under the bed frame to raise the head of the bed. What foods are not recommended? The items listed may not be a complete list. Talk with your dietitian about what dietary choices are best for you. Grains Pastries or quick breads with added fat. [place] toast. Vegetables Deep fried vegetables. [place] fries. Any vegetables prepared with added fat. Any vegetables that cause symptoms. For some people this may include tomatoes and tomato products, chili peppers, onions and garlic, and horseradish. Fruits Any fruits prepared with added fat. Any fruits that cause symptoms. For some people this may include citrus fruits, such as oranges,  grapefruit, pineapple, and lemons. Meats and other protein foods High-fat meats, such as fatty beef or pork, hot dogs, ribs, ham, sausage, salami and bacon. Fried meat or protein, including fried fish and fried chicken. Nuts  and nut butters. Dairy Whole milk and chocolate milk. Sour cream. Cream. Ice cream. Cream cheese. Milk shakes. Beverages Coffee and tea, with or without caffeine. Carbonated beverages. Sodas. Energy drinks. Fruit juice made with acidic fruits (such as orange or grapefruit). Tomato juice. Alcoholic drinks. Fats and oils Butter. Margarine. Shortening. Ghee. Sweets and desserts Chocolate and cocoa. Donuts. Seasoning and other foods Pepper. Peppermint and spearmint. Any condiments, herbs, or seasonings that cause symptoms. For some people, this may include curry, hot sauce, or vinegar-based salad dressings. Summary  When you have gastroesophageal reflux disease (GERD), food and lifestyle choices are very important to help ease the discomfort of GERD.  Eat frequent, small meals instead of three large meals each day. Eat your meals slowly, in a relaxed setting. Avoid bending over or lying down until 2-3 hours after eating.  Limit high-fat foods such as fatty meat or fried foods. This information is not intended to replace advice given to you by your health care provider. Make sure you discuss any questions you have with your health care provider. Document Revised: 06/15/2018 Document Reviewed: 02/24/2016 Elsevier Patient Education  [place].   Nonspecific Chest Pain, Adult Chest pain can be caused by many different conditions. It can be caused by a condition that is life-threatening and requires treatment right away. It can also be caused by something that is not life-threatening. If you have chest pain, it can be hard to know the difference, so it is important to get help right away to make sure that you do not have a serious condition. Some life-threatening  causes of chest pain include:  Heart attack.  A tear in the body's main blood vessel (aortic dissection).  Inflammation around your heart (pericarditis).  A problem in the lungs, such as a blood clot (pulmonary embolism) or a collapsed lung (pneumothorax). Some non life-threatening causes of chest pain include:  Heartburn.  Anxiety or stress.  Damage to the bones, muscles, and cartilage that make up your chest wall.  Pneumonia or bronchitis.  Shingles infection (varicella-zoster virus). Chest pain can feel like:  Pain or discomfort on the surface of your chest or deep in your chest.  Crushing, pressure, aching, or squeezing pain.  Burning or tingling.  Dull or sharp pain that is worse when you move, cough, or take a deep breath.  Pain or discomfort that is also felt in your back, neck, jaw, shoulder, or arm, or pain that spreads to any of these areas. Your chest pain may come and go. It may also be constant. Your health care provider will do lab tests and other studies to find the cause of your pain. Treatment will depend on the cause of your chest pain. Follow these instructions at home: Medicines  Take over-the-counter and prescription medicines only as told by your health care provider.  If you were prescribed an antibiotic, take it as told by your health care provider. Do not stop taking the antibiotic even if you start to feel better. Lifestyle   Rest as directed by your health care provider.  Do not use any products that contain nicotine or tobacco, such as cigarettes and e-cigarettes. If you need help quitting, ask your health care provider.  Do not drink alcohol.  Make healthy lifestyle choices as recommended. These may include: ? Getting regular exercise. Ask your health care provider to suggest some activities that are safe for you. ? Eating a heart-healthy diet. This includes plenty of fresh fruits and vegetables, whole grains,  low-fat (lean) protein, and  low-fat dairy products. A dietitian can help you find healthy eating options. ? Maintaining a healthy weight. ? Managing any other health conditions you have, such as high blood pressure (hypertension) or diabetes. ? Reducing stress, such as with yoga or relaxation techniques. General instructions  Pay attention to any changes in your symptoms. Tell your health care provider about them or any new symptoms.  Avoid any activities that cause chest pain.  Keep all follow-up visits as told by your health care provider. This is important. This includes visits for any further testing if your chest pain does not go away. Contact a health care provider if:  Your chest pain does not go away.  You feel depressed.  You have a fever. Get help right away if:  Your chest pain gets worse.  You have a cough that gets worse, or you cough up blood.  You have severe pain in your abdomen.  You faint.  You have sudden, unexplained chest discomfort.  You have sudden, unexplained discomfort in your arms, back, neck, or jaw.  You have shortness of breath at any time.  You suddenly start to sweat, or your skin gets clammy.  You feel nausea or you vomit.  You suddenly feel lightheaded or dizzy.  You have severe weakness, or unexplained weakness or fatigue.  Your heart begins to beat quickly, or it feels like it is skipping beats. These symptoms may represent a serious problem that is an emergency. Do not wait to see if the symptoms will go away. Get medical help right away. Call your local emergency services (911 in the [place] Do not drive yourself to the hospital. Summary  Chest pain can be caused by a condition that is serious and requires urgent treatment. It may also be caused by something that is not life-threatening.  If you have chest pain, it is very important to see your health care provider. Your health care provider may do lab tests and other studies to find the cause of your  pain.  Follow your health care provider's instructions on taking medicines, making lifestyle changes, and getting emergency treatment if symptoms become worse.  Keep all follow-up visits as told by your health care provider. This includes visits for any further testing if your chest pain does not go away. This information is not intended to replace advice given to you by your health care provider. Make sure you discuss any questions you have with your health care provider. Document Revised: 08/25/2017 Document Reviewed: 08/25/2017 Elsevier Patient Education  2020 [place] with Quitting Smoking  Quitting smoking is a physical and mental challenge. You will face cravings, withdrawal symptoms, and temptation. Before quitting, work with your health care provider to make a plan that can help you cope. Preparation can help you quit and keep you from giving in. How can I cope with cravings? Cravings usually last for 5-10 minutes. If you get through it, the craving will pass. Consider taking the following actions to help you cope with cravings:  Keep your mouth busy: ? Chew sugar-free gum. ? Suck on hard candies or a straw. ? Brush your teeth.  Keep your hands and body busy: ? Immediately change to a different activity when you feel a craving. ? Squeeze or play with a ball. ? Do an activity or a hobby, like making bead jewelry, practicing needlepoint, or working with wood. ? Mix up your normal routine. ? Take a short exercise break.  Go for a quick walk or run up and down stairs. ? Spend time in public places where smoking is not allowed.  Focus on doing something kind or helpful for someone else.  Call a friend or family member to talk during a craving.  Join a support group.  Call a quit line, such as 1-800-QUIT-NOW.  Talk with your health care provider about medicines that might help you cope with cravings and make quitting easier for you. How can I deal with withdrawal  symptoms? Your body may experience negative effects as it tries to get used to not having nicotine in the system. These effects are called withdrawal symptoms. They may include:  Feeling hungrier than normal.  Trouble concentrating.  Irritability.  Trouble sleeping.  Feeling depressed.  Restlessness and agitation.  Craving a cigarette. To manage withdrawal symptoms:  Avoid places, people, and activities that trigger your cravings.  Remember why you want to quit.  Get plenty of sleep.  Avoid coffee and other caffeinated drinks. These may worsen some of your symptoms. How can I handle social situations? Social situations can be difficult when you are quitting smoking, especially in the first few weeks. To manage this, you can:  Avoid parties, bars, and other social situations where people might be smoking.  Avoid alcohol.  Leave right away if you have the urge to smoke.  Explain to your family and friends that you are quitting smoking. Ask for understanding and support.  Plan activities with friends or family where smoking is not an option. What are some ways I can cope with stress? Wanting to smoke may cause stress, and stress can make you want to smoke. Find ways to manage your stress. Relaxation techniques can help. For example:  Breathe slowly and deeply, in through your nose and out through your mouth.  Listen to soothing, relaxing music.  Talk with a family member or friend about your stress.  Light a candle.  Soak in a bath or take a shower.  Think about a peaceful place. What are some ways I can prevent weight gain? Be aware that many people gain weight after they quit smoking. However, not everyone does. To keep from gaining weight, have a plan in place before you quit and stick to the plan after you quit. Your plan should include:  Having healthy snacks. When you have a craving, it may help to: ? Eat plain popcorn, crunchy carrots, celery, or other cut  vegetables. ? Chew sugar-free gum.  Changing how you eat: ? Eat small portion sizes at meals. ? Eat 4-6 small meals throughout the day instead of 1-2 large meals a day. ? Be mindful when you eat. Do not watch television or do other things that might distract you as you eat.  Exercising regularly: ? Make time to exercise each day. If you do not have time for a long workout, do short bouts of exercise for 5-10 minutes several times a day. ? Do some form of strengthening exercise, like weight lifting, and some form of aerobic exercise, like running or swimming.  Drinking plenty of water or other low-calorie or no-calorie drinks. Drink 6-8 glasses of water daily, or as much as instructed by your health care provider. Summary  Quitting smoking is a physical and mental challenge. You will face cravings, withdrawal symptoms, and temptation to smoke again. Preparation can help you as you go through these challenges.  You can cope with cravings by keeping your mouth busy (such as by chewing  gum), keeping your body and hands busy, and making calls to family, friends, or a helpline for people who want to quit smoking.  You can cope with withdrawal symptoms by avoiding places where people smoke, avoiding drinks with caffeine, and getting plenty of rest.  Ask your health care provider about the different ways to prevent weight gain, avoid stress, and handle social situations. This information is not intended to replace advice given to you by your health care provider. Make sure you discuss any questions you have with your health care provider. Document Revised: 02/04/2017 Document Reviewed: 02/20/2016 Elsevier Patient Education  [employer].    If you have lab work done today you will be contacted with your lab results within the next 2 weeks.  If you have not heard from [place] then please contact us. The fastest way to get your results is to register for My Chart.   IF you received an x-ray  today, you will receive an invoice from [facility]. Please contact [facility] at [phone] with questions or concerns regarding your invoice.   IF you received labwork today, you will receive an invoice from [facility]. Please contact [facility] at [phone] with questions or concerns regarding your invoice.   Our billing staff will not be able to assist you with questions regarding bills from these companies.  You will be contacted with the lab results as soon as they are available. The fastest way to get your results is to activate your My Chart account. Instructions are located on the last page of this paperwork. If you have not heard from [place] regarding the results in 2 weeks, please contact this office.          Signed, Merri Ray, MD [facility]

## 2019-09-17 NOTE — Progress Notes (Signed)
e

## 2019-09-18 ENCOUNTER — Encounter: Payer: Self-pay | Admitting: Family Medicine

## 2019-09-18 LAB — CBC
Hematocrit: 47.9 % (ref 37.5–51.0)
Hemoglobin: 16.6 g/dL (ref 13.0–17.7)
MCH: 31.3 pg (ref 26.6–33.0)
MCHC: 34.7 g/dL (ref 31.5–35.7)
MCV: 90 fL (ref 79–97)
Platelets: 152 10*3/uL (ref 150–450)
RBC: 5.3 x10E6/uL (ref 4.14–5.80)
RDW: 11.7 % (ref 11.6–15.4)
WBC: 5.3 10*3/uL (ref 3.4–10.8)

## 2019-09-18 LAB — SEDIMENTATION RATE: Sed Rate: 2 mm/hr (ref 0–15)

## 2019-10-18 ENCOUNTER — Other Ambulatory Visit: Payer: Self-pay

## 2019-10-18 ENCOUNTER — Encounter: Payer: Self-pay | Admitting: Family Medicine

## 2019-10-18 ENCOUNTER — Ambulatory Visit: Payer: 59 | Admitting: Family Medicine

## 2019-10-18 VITALS — BP 136/84 | HR 76 | Temp 98.3°F | Ht 72.0 in | Wt 175.0 lb

## 2019-10-18 DIAGNOSIS — F418 Other specified anxiety disorders: Secondary | ICD-10-CM

## 2019-10-18 DIAGNOSIS — R079 Chest pain, unspecified: Secondary | ICD-10-CM | POA: Diagnosis not present

## 2019-10-18 DIAGNOSIS — R12 Heartburn: Secondary | ICD-10-CM | POA: Diagnosis not present

## 2019-10-18 DIAGNOSIS — Z23 Encounter for immunization: Secondary | ICD-10-CM

## 2019-10-18 NOTE — Patient Instructions (Addendum)
Stay on omeprazole once per day - see foods to avoid below. If heartburn improves, can decrease omeprazole to as needed.   I do recommend meeting with therapist if persistent anxiety. See some ways below to manage anxiety as well. Recheck in 3 months,sooner if needed. I would consider another attempt at medication if symptoms persist or worsen.    Managing Anxiety, Adult After being diagnosed with an anxiety disorder, you may be relieved to know why you have felt or behaved a certain way. You may also feel overwhelmed about the treatment ahead and what it will mean for your life. With care and support, you can manage this condition and recover from it. How to manage lifestyle changes Managing stress and anxiety  Stress is your body's reaction to life changes and events, both good and bad. Most stress will last just a few hours, but stress can be ongoing and can lead to more than just stress. Although stress can play a major role in anxiety, it is not the same as anxiety. Stress is usually caused by something external, such as a deadline, test, or competition. Stress normally passes after the triggering event has ended.  Anxiety is caused by something internal, such as imagining a terrible outcome or worrying that something will go wrong that will devastate you. Anxiety often does not go away even after the triggering event is over, and it can become long-term (chronic) worry. It is important to understand the differences between stress and anxiety and to manage your stress effectively so that it does not lead to an anxious response. Talk with your health care provider or a counselor to learn more about reducing anxiety and stress. He or she may suggest tension reduction techniques, such as:  Music therapy. This can include creating or listening to music that you enjoy and that inspires you.  Mindfulness-based meditation. This involves being aware of your normal breaths while not trying to control  your breathing. It can be done while sitting or walking.  Centering prayer. This involves focusing on a word, phrase, or sacred image that means something to you and brings you peace.  Deep breathing. To do this, expand your stomach and inhale slowly through your nose. Hold your breath for 3-5 seconds. Then exhale slowly, letting your stomach muscles relax.  Self-talk. This involves identifying thought patterns that lead to anxiety reactions and changing those patterns.  Muscle relaxation. This involves tensing muscles and then relaxing them. Choose a tension reduction technique that suits your lifestyle and personality. These techniques take time and practice. Set aside 5-15 minutes a day to do them. Therapists can offer counseling and training in these techniques. The training to help with anxiety may be covered by some insurance plans. Other things you can do to manage stress and anxiety include:  Keeping a stress/anxiety diary. This can help you learn what triggers your reaction and then learn ways to manage your response.  Thinking about how you react to certain situations. You may not be able to control everything, but you can control your response.  Making time for activities that help you relax and not feeling guilty about spending your time in this way.  Visual imagery and yoga can help you stay calm and relax.  Medicines Medicines can help ease symptoms. Medicines for anxiety include:  Anti-anxiety drugs.  Antidepressants. Medicines are often used as a primary treatment for anxiety disorder. Medicines will be prescribed by a health care provider. When used together, medicines, psychotherapy, and  tension reduction techniques may be the most effective treatment. Relationships Relationships can play a big part in helping you recover. Try to spend more time connecting with trusted friends and family members. Consider going to couples counseling, taking family education classes, or  going to family therapy. Therapy can help you and others better understand your condition. How to recognize changes in your anxiety Everyone responds differently to treatment for anxiety. Recovery from anxiety happens when symptoms decrease and stop interfering with your daily activities at home or work. This may mean that you will start to:  Have better concentration and focus. Worry will interfere less in your daily thinking.  Sleep better.  Be less irritable.  Have more energy.  Have improved memory. It is important to recognize when your condition is getting worse. Contact your health care provider if your symptoms interfere with home or work and you feel like your condition is not improving. Follow these instructions at home: Activity  Exercise. Most adults should do the following: ? Exercise for at least 150 minutes each week. The exercise should increase your heart rate and make you sweat (moderate-intensity exercise). ? Strengthening exercises at least twice a week.  Get the right amount and quality of sleep. Most adults need 7-9 hours of sleep each night. Lifestyle   Eat a healthy diet that includes plenty of vegetables, fruits, whole grains, low-fat dairy products, and lean protein. Do not eat a lot of foods that are high in solid fats, added sugars, or salt.  Make choices that simplify your life.  Do not use any products that contain nicotine or tobacco, such as cigarettes, e-cigarettes, and chewing tobacco. If you need help quitting, ask your health care provider.  Avoid caffeine, alcohol, and certain over-the-counter cold medicines. These may make you feel worse. Ask your pharmacist which medicines to avoid. General instructions  Take over-the-counter and prescription medicines only as told by your health care provider.  Keep all follow-up visits as told by your health care provider. This is important. Where to find support You can get help and support from these  sources:  Self-help groups.  Online and [employer].  A trusted spiritual leader.  Couples counseling.  Family education classes.  Family therapy. Where to find more information You may find that joining a support group helps you deal with your anxiety. The following sources can help you locate counselors or support groups near you:  Mental Health America: www.mentalhealthamerica.net  Anxiety and Depression Association of [place] (ADAA): ProgramCam.de  [employer] on Mental Illness (NAMI): www.nami.org Contact a health care provider if you:  Have a hard time staying focused or finishing daily tasks.  Spend many hours a day feeling worried about everyday life.  Become exhausted by worry.  Start to have headaches, feel tense, or have nausea.  Urinate more than normal.  Have diarrhea. Get help right away if you have:  A racing heart and shortness of breath.  Thoughts of hurting yourself or others. If you ever feel like you may hurt yourself or others, or have thoughts about taking your own life, get help right away. You can go to your nearest emergency department or call:  Your local emergency services (911 in the [place]  A suicide crisis helpline, such as the National Suicide Prevention Lifeline at (912)818-6952. This is open 24 hours a day. Summary  Taking steps to learn and use tension reduction techniques can help calm you and help prevent triggering an anxiety reaction.  When used  together, medicines, psychotherapy, and tension reduction techniques may be the most effective treatment.  Family, friends, and partners can play a big part in helping you recover from an anxiety disorder. This information is not intended to replace advice given to you by your health care provider. Make sure you discuss any questions you have with your health care provider. Document Revised: 07/25/2018 Document Reviewed: 07/25/2018 Elsevier Patient Education  2020  [employer].    Food Choices for Gastroesophageal Reflux Disease, Adult When you have gastroesophageal reflux disease (GERD), the foods you eat and your eating habits are very important. Choosing the right foods can help ease your discomfort. Think about working with a nutrition specialist (dietitian) to help you make good choices. What are tips for following this plan?  Meals  Choose healthy foods that are low in fat, such as fruits, vegetables, whole grains, low-fat dairy products, and lean meat, fish, and poultry.  Eat small meals often instead of 3 large meals a day. Eat your meals slowly, and in a place where you are relaxed. Avoid bending over or lying down until 2-3 hours after eating.  Avoid eating meals 2-3 hours before bed.  Avoid drinking a lot of liquid with meals.  Cook foods using methods other than frying. Bake, grill, or broil food instead.  Avoid or limit: ? Chocolate. ? Peppermint or spearmint. ? Alcohol. ? Pepper. ? Black and decaffeinated coffee. ? Black and decaffeinated tea. ? Bubbly (carbonated) soft drinks. ? Caffeinated energy drinks and soft drinks.  Limit high-fat foods such as: ? Fatty meat or fried foods. ? Whole milk, cream, butter, or ice cream. ? Nuts and nut butters. ? Pastries, donuts, and sweets made with butter or shortening.  Avoid foods that cause symptoms. These foods may be different for everyone. Common foods that cause symptoms include: ? Tomatoes. ? Oranges, lemons, and limes. ? Peppers. ? Spicy food. ? Onions and garlic. ? Vinegar. Lifestyle  Maintain a healthy weight. Ask your doctor what weight is healthy for you. If you need to lose weight, work with your doctor to do so safely.  Exercise for at least 30 minutes for 5 or more days each week, or as told by your doctor.  Wear loose-fitting clothes.  Do not smoke. If you need help quitting, ask your doctor.  Sleep with the head of your bed higher than your feet. Use a  wedge under the mattress or blocks under the bed frame to raise the head of the bed. Summary  When you have gastroesophageal reflux disease (GERD), food and lifestyle choices are very important in easing your symptoms.  Eat small meals often instead of 3 large meals a day. Eat your meals slowly, and in a place where you are relaxed.  Limit high-fat foods such as fatty meat or fried foods.  Avoid bending over or lying down until 2-3 hours after eating.  Avoid peppermint and spearmint, caffeine, alcohol, and chocolate. This information is not intended to replace advice given to you by your health care provider. Make sure you discuss any questions you have with your health care provider. Document Revised: 06/15/2018 Document Reviewed: 03/30/2016 Elsevier Patient Education  [employer].    If you have lab work done today you will be contacted with your lab results within the next 2 weeks.  If you have not heard from [place] then please contact us. The fastest way to get your results is to register for My Chart.   IF you received  an x-ray today, you will receive an invoice from [facility]. Please contact [facility] at [phone] with questions or concerns regarding your invoice.   IF you received labwork today, you will receive an invoice from [place]. Please contact [facility] at [phone] with questions or concerns regarding your invoice.   Our billing staff will not be able to assist you with questions regarding bills from these companies.  You will be contacted with the lab results as soon as they are available. The fastest way to get your results is to activate your My Chart account. Instructions are located on the last page of this paperwork. If you have not heard from [place] regarding the results in 2 weeks, please contact this office.    `

## 2019-10-18 NOTE — Progress Notes (Signed)
Subjective:  Patient ID: Philip Spencer, male    DOB: January 10, 1991  Age: 29 y.o. MRN: [id number]  CC:  Chief Complaint  Patient presents with  . Follow-up    on chest pain, and situational anxiety. Pt reports his chest pain has gotten better since last OV. PT als reports no cvhange to situational anxiety since last OV.     HPI Philip Spencer presents for   Follow-up from July 12.  Chest pain: Noted for the past 4 to 5 years at last visit.  Some heartburn at times.  History of pectus excavatum.  Atypical, no concerning findings on EKG.  Sed rate, CBC were reassuring.  Less likely infectious inflammatory.  Possible heartburn versus chest wall pain.  Handout given, trial of Pepcid.  Trigger foods discussed.  Did not try pepcid, started prilosec otc QD past week or so. Helps for chest symptoms. Acid reflux comes and goes. Certain foods worsen. Did not see list of foods last visit. Chest pains have improved.  Caffeine: 1 medium  per day - decreasing use.  No further nicotine. Quit smoking in February.  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  Results for orders placed or performed in visit on 09/17/19  CBC  Result Value Ref Range   WBC 5.3 3.4 - 10.8 x10E3/uL   RBC 5.30 4.14 - 5.80 x10E6/uL   Hemoglobin 16.6 13.0 - 17.7 g/dL   Hematocrit 11.1 73.5 - 51.0 %   MCV 90 79 - 97 fL   MCH 31.3 26.6 - 33.0 pg   MCHC 34.7 31 - 35 g/dL   RDW 67.0 14.1 - 03.0 %   Platelets 152 150 - 450 x10E3/uL  Sedimentation Rate  Result Value Ref Range   Sed Rate 2 0 - 15 mm/hr    Situational anxiety Possible underlying generalized anxiety with some improvement in symptoms prior to his last visit.  Phone numbers were provided for therapist. Has not called therapist.  Just registered for school - full schedule, Political science, possible change ot business. [employer].  Has been trying mental approach - trying to talk about things and not internalizing. Feels anxious if not  fulfilling his role or goals.  Returning to school will be good for this. Would like to see how anxiety does around other people at school, open to meeting with therapist.  Prior rehab for substance use in 2014. Short term med for about a month for anxiety/depression.  did not notice difference.  GAD 7 : Generalized Anxiety Score 10/18/2019 09/17/2019  Nervous, Anxious, on Edge 1 1  Control/stop worrying 1 0  Worry too much - different things 1 1  Trouble relaxing 0 0  Restless 0 0  Easily annoyed or irritable 1 1  Afraid - awful might happen 1 1  Total GAD 7 Score 5 4  Anxiety Difficulty - Somewhat difficult    Depression screen [facility] 2/9 10/18/2019 09/17/2019  Decreased Interest 0 0  Down, Depressed, Hopeless 0 0  PHQ - 2 Score 0 0     History There are no problems to display for this patient.  Past Medical History:  Diagnosis Date  . Substance abuse (HCC)    clean/sober since 08/24/2012   No past surgical history on file. No Known Allergies Prior to Admission medications   Medication Sig Start Date End Date Taking? Authorizing Provider  clindamycin-benzoyl peroxide (BENZACLIN) gel Apply 1 application topically every other day. 07/05/19  Yes [provider]  tretinoin (RETIN-A) 0.05 % cream Apply topically as directed. 07/05/19  Yes [provider]   Social History   Socioeconomic History  . Marital status: Single    Spouse name: n/a  . Number of children: 0  . Years of education: Not on file  . Highest education level: Not on file  Occupational History  . Occupation: Airline pilot +    Comment: e-vape store  Tobacco Use  . Smoking status: Former Smoker    Packs/day: 0.50    Types: Cigarettes    Quit date: 04/13/2019    Years since quitting: 0.5  . Smokeless tobacco: Never Used  Substance and Sexual Activity  . Alcohol use: Yes    Comment: occasionally  . Drug use: No    Types: Benzodiazepines, Marijuana, Other-see comments    Comment: Clean since 08/24/2012  (psychedelics, opiods)  . Sexual activity: Not on file  Other Topics Concern  . Not on file  Social History Narrative   Lives with his parents.   Social Determinants of Health   Financial Resource Strain:   . Difficulty of Paying Living Expenses:   Food Insecurity:   . Worried About Programme researcher, broadcasting/film/video in the Last Year:   . Barista in the Last Year:   Transportation Needs:   . Freight forwarder (Medical):   Marland Kitchen Lack of Transportation (Non-Medical):   Physical Activity:   . Days of Exercise per Week:   . Minutes of Exercise per Session:   Stress:   . Feeling of Stress :   Social Connections:   . Frequency of Communication with Friends and Family:   . Frequency of Social Gatherings with Friends and Family:   . Attends Religious Services:   . Active Member of Clubs or Organizations:   . Attends Banker Meetings:   Marland Kitchen Marital Status:   Intimate Partner Violence:   . Fear of Current or Ex-Partner:   . Emotionally Abused:   Marland Kitchen Physically Abused:   . Sexually Abused:     Review of Systems   Objective:   Vitals:   10/18/19 1056 10/18/19 1107  BP: (!) 146/99 136/84  Pulse: 76   Temp: 98.3 F (36.8 C)   TempSrc: Temporal   SpO2: 97%   Weight: 175 lb (79.4 kg)   Height: 6' (1.829 m)      Physical Exam Vitals reviewed.  Constitutional:      Appearance: He is well-developed.  HENT:     Head: Normocephalic and atraumatic.  Eyes:     Pupils: Pupils are equal, round, and reactive to light.  Neck:     Vascular: No carotid bruit or JVD.  Cardiovascular:     Rate and Rhythm: Normal rate and regular rhythm.     Heart sounds: Normal heart sounds. No murmur heard.   Pulmonary:     Effort: Pulmonary effort is normal.     Breath sounds: Normal breath sounds. No rales.  Abdominal:     General: Abdomen is flat. There is no distension.     Tenderness: There is no abdominal tenderness.  Skin:    General: Skin is warm and dry.  Neurological:      General: No focal deficit present.     Mental Status: He is alert and oriented to person, place, and time.  Psychiatric:        Mood and Affect: Mood normal.        Behavior: Behavior normal.  Assessment & Plan:  Reinhard R Oakley is a 29 y.o. male . Situational anxiety  -Deferred medication at this time, recommended counseling, handout given on managing anxiety with RTC precautions if persistent at school.  Need for prophylactic vaccination with combined diphtheria-tetanus-pertussis (DTP) vaccine - Plan: Tdap vaccine greater than or equal to 7yo IM  Chest pain, unspecified type Heartburn -Symptoms have improved, trigger avoidance discussed with heartburn, over-the-counter PPI as needed.  RTC precautions  No orders of the defined types were placed in this encounter.  Patient Instructions       If you have lab work done today you will be contacted with your lab results within the next 2 weeks.  If you have not heard from [place] then please contact us. The fastest way to get your results is to register for My Chart.   IF you received an x-ray today, you will receive an invoice from [facility]. Please contact [facility] at [phone] with questions or concerns regarding your invoice.   IF you received labwork today, you will receive an invoice from [place]. Please contact [facility] at [phone] with questions or concerns regarding your invoice.   Our billing staff will not be able to assist you with questions regarding bills from these companies.  You will be contacted with the lab results as soon as they are available. The fastest way to get your results is to activate your My Chart account. Instructions are located on the last page of this paperwork. If you have not heard from [place] regarding the results in 2 weeks, please contact this office.         Signed, Meredith Staggers, MD [facility]

## 2019-10-29 ENCOUNTER — Telehealth: Payer: Self-pay | Admitting: Family Medicine

## 2019-10-29 NOTE — Telephone Encounter (Signed)
Pt is calling regarding last time he was in the office he discussed possibly getting a referral to a therapist. Pt is interested in doing so and would like a referral put in/ or giving a list of good ones that providers recommends. Please advise.

## 2019-10-29 NOTE — Telephone Encounter (Signed)
Pt thought about the referral for therapist and is now in agreement, pt would like you to place order 10/18/2019 service date

## 2019-10-30 NOTE — Telephone Encounter (Signed)
Attempted to call pt no answer so I left a message to call back also stated that I will give pt the information via my chart.

## 2019-10-30 NOTE — Telephone Encounter (Signed)
Referral should not be needed for therapist/psychology.  Please see July 12 visit.  He was given a list of therapist but here they are again if needed.  He should call to schedule appointment.  Typically those offices prefer patient call to schedule appointment themselves.  Here are a few options for counseling:  [facility]:  [phone]  [facility] [phone]

## 2019-11-01 ENCOUNTER — Encounter: Payer: Self-pay | Admitting: Family Medicine

## 2019-11-23 ENCOUNTER — Ambulatory Visit: Payer: 59 | Admitting: Family Medicine

## 2019-11-23 ENCOUNTER — Encounter: Payer: Self-pay | Admitting: Family Medicine

## 2019-11-23 ENCOUNTER — Other Ambulatory Visit: Payer: Self-pay

## 2019-11-23 VITALS — BP 130/88 | HR 69 | Temp 98.3°F | Ht 72.0 in | Wt 174.0 lb

## 2019-11-23 DIAGNOSIS — Z23 Encounter for immunization: Secondary | ICD-10-CM

## 2019-11-23 DIAGNOSIS — Q676 Pectus excavatum: Secondary | ICD-10-CM

## 2019-11-23 NOTE — Progress Notes (Signed)
Subjective:  Patient ID: Philip Spencer, male    DOB: 17-May-1990  Age: 29 y.o. MRN: [id number]  CC:  Chief Complaint  Patient presents with  . surgery request    Pt would like to disscuss having a Nuss procedure done to treat pectus excavatum. Pt has met with a surgin in [place] for this prcedure. Pt has brought a packet of pre-suregery requierments from that surgen. Pt would like the providers thoughts on this and if any local surgens in the area could do this so that it might be covered by his insurance.    HPI Philip Spencer presents for   Pectus excavatum: Considering surgery for this condition.  He did meet with a provider in [place] to discuss treatment, specifically Nuss procedure.  Saw provider at [place] at age 70 or 40. No surgery. Not thought to be medically necessary based on depth/haller index.   Cosmetic concerns, but also concerned about prior sports induced asthma.  Quit smoking 7 months ago. Short of breath with activity even after stopping smoking. Maybe slight better off cigarettes.   Had zoom call with Dr Glenis Smoker - pectus surgeon in [place]. Talked to his nurse.  appt 12/15 with Dr. Nani Ravens. Tests recommended prior to be done here to decide if surgery indicated. Can have testing up there if needed, but as list of testing needed.  Possible surgery in spring/summer 2022.   No hx of noonan or ehlers danlos, osteogenesis imperfecta, or marfan.   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 Marland Kitchen   History There are no problems to display for this patient.  Past Medical History:  Diagnosis Date  . Substance abuse ([place])    clean/sober since 08/24/2012   History reviewed. No pertinent surgical history. No Known Allergies Prior to Admission medications   Medication Sig Start Date End Date Taking? Authorizing Provider  clindamycin-benzoyl peroxide (BENZACLIN) gel Apply 1 application topically every other day. 07/05/19  Yes [provider]  tretinoin  (RETIN-A) 0.05 % cream Apply topically as directed. 07/05/19  Yes [provider]   Social History   Socioeconomic History  . Marital status: Single    Spouse name: n/a  . Number of children: 0  . Years of education: Not on file  . Highest education level: Not on file  Occupational History  . 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 Smoking status: Former Smoker    Packs/day: 0.50    Types: Cigarettes    Quit date: 04/13/2019    Years since quitting: 0.6  . Smokeless tobacco: Never Used  Substance and Sexual Activity  . Alcohol use: Yes    Comment: occasionally  . Drug use: No    Types: Benzodiazepines, Marijuana, Other-see comments    Comment: Clean since 5/78/4696 (psychedelics, opiods)  . Sexual activity: Not on file  Other Topics Concern  . Not on file  Social History Narrative   Lives with his parents.   Social Determinants of Health   Financial Resource Strain:   . Difficulty of Paying Living Expenses: Not on file  Food Insecurity:   . Worried About Charity fundraiser in the Last Year: Not on file  . Ran Out of Food in the Last Year: Not on file  Transportation Needs:   . Lack of Transportation (Medical): Not on file  . Lack of Transportation (Non-Medical): Not on file  Physical Activity:   . Days  of Exercise per Week: Not on file  . Minutes of Exercise per Session: Not on file  Stress:   . Feeling of Stress : Not on file  Social Connections:   . Frequency of Communication with Friends and Family: Not on file  . Frequency of Social Gatherings with Friends and Family: Not on file  . Attends Religious Services: Not on file  . Active Member of Clubs or Organizations: Not on file  . Attends Archivist Meetings: Not on file  . Marital Status: Not on file  Intimate Partner Violence:   . Fear of Current or Ex-Partner: Not on file  . Emotionally Abused: Not on file  . Physically Abused: Not on file  . Sexually Abused: Not on  file    Review of Systems Per hpi   Objective:   Vitals:   11/23/19 1134 11/23/19 1139  BP: (!) 143/93 130/88  Pulse: 69   Temp: 98.3 F (36.8 C)   TempSrc: Temporal   SpO2: 98%   Weight: 174 lb (78.9 kg)   Height: 6' (1.829 m)      Physical Exam Vitals reviewed.  Constitutional:      General: He is not in acute distress.    Appearance: He is well-developed.  HENT:     Head: Normocephalic and atraumatic.  Cardiovascular:     Rate and Rhythm: Normal rate.  Pulmonary:     Effort: Pulmonary effort is normal.  Chest:    Neurological:     Mental Status: He is alert and oriented to person, place, and time.        Assessment & Plan:  Philip Spencer is a 29 y.o. male . Pectus excavatum  -Longstanding symptoms, does report some dyspnea with exercise, possible exercise-induced asthma previously.  Improved with smoking cessation.   - Likely will need some further testing prior to decision on surgical intervention including pulmonary function testing, echo, stress testing.  He will check into insurance coverage to see if that would be easier to have that done with specialist in [place] or I am happy to refer him here locally.  Need for prophylactic vaccination with combined diphtheria-tetanus-pertussis (DTP) vaccine - Plan: Td vaccine greater than or equal to 7yo preservative free IM  No orders of the defined types were placed in this encounter.  Patient Instructions   Check on coverage of testing up [place] for pectus procedure.   I am happy to refer you locally if needed. Let me know how I can help further.       If you have lab work done today you will be contacted with your lab results within the next 2 weeks.  If you have not heard from [place] then please contact us. The fastest way to get your results is to register for My Chart.   IF you received an x-ray today, you will receive an invoice from [facility]. Please contact [facility] at  [phone] with questions or concerns regarding your invoice.   IF you received labwork today, you will receive an invoice from [place]. Please contact [facility] at [phone] with questions or concerns regarding your invoice.   Our billing staff will not be able to assist you with questions regarding bills from these companies.  You will be contacted with the lab results as soon as they are available. The fastest way to get your results is to activate your My Chart account. Instructions are located on the last page of this paperwork. If  you have not heard from [place] regarding the results in 2 weeks, please contact this office.         Signed, Merri Ray, MD [facility]

## 2019-11-23 NOTE — Patient Instructions (Addendum)
[place].   I am happy to refer you locally if needed. Let me know how I can help further.       If you have lab work done today you will be contacted with your lab results within the next 2 weeks.  If you have not heard from [place] then please contact us. The fastest way to get your results is to register for My Chart.   IF you received an x-ray today, you will receive an invoice from [facility]. Please contact [facility] Radiology at [phone] with questions or concerns regarding your invoice.   IF you received labwork today, you will receive an invoice from [place]. Please contact [facility] at [phone] with questions or concerns regarding your invoice.   Our billing staff will not be able to assist you with questions regarding bills from these companies.  You will be contacted with the lab results as soon as they are available. The fastest way to get your results is to activate your My Chart account. Instructions are located on the last page of this paperwork. If you have not heard from [place] regarding the results in 2 weeks, please contact this office.

## 2020-01-06 ENCOUNTER — Encounter: Payer: Self-pay | Admitting: Family Medicine

## 2020-01-18 ENCOUNTER — Ambulatory Visit: Payer: 59 | Admitting: Family Medicine

## 2020-01-18 ENCOUNTER — Other Ambulatory Visit: Payer: Self-pay

## 2020-01-18 ENCOUNTER — Encounter: Payer: Self-pay | Admitting: Family Medicine

## 2020-01-18 VITALS — BP 130/86 | HR 70 | Temp 98.3°F | Ht 72.0 in | Wt 174.0 lb

## 2020-01-18 DIAGNOSIS — F418 Other specified anxiety disorders: Secondary | ICD-10-CM

## 2020-01-18 MED ORDER — SERTRALINE HCL 25 MG PO TABS: 25.0000 mg | ORAL_TABLET | Freq: Every day | ORAL | 1 refills | Status: DC

## 2020-01-18 NOTE — Progress Notes (Addendum)
Subjective:  Patient ID: Philip Spencer, male    DOB: Jun 25, 1990  Age: 29 y.o. MRN: [id number]  CC:  Chief Complaint  Patient presents with  . Depression    Pt reports last time he spoke with the provider about this at his last OV that if he felt that he needed to dive deeper and look into treatment option to come back in. pt states no change to the condition since last ov, but want to see if the provider thinks treatment might help.    HPI [name] presents for   Depression/anxiety Discussed in July, August.  Thought to have underlies general anxiety with some improvements at last visit.  #Been provided for therapist if needed.  Some component of situational anxiety.  It was thought that return to school would be helpful for some of his anxiety.  As symptoms are improving in August, medication was deferred.  Since that time still with some anxiety/depression symptoms.  Longstanding symptoms, worse past 6 months with adjustments.  Never found therapist that was covered with his insurance. Would like to try SSRI.  Some depressed mood, some triggers that send down negative thought pattern. overwhelmed at times. School going well - getting good grades. Has been an adjustment. Lack of drive/anhedonia at times few times per week.  Exercise: jogging 3-4 days per week - helpful.  ? Unsure if SSRI in early 20's. Unknown name.  Marijuana once per day - helps to calm down.   GAD 7 : Generalized Anxiety Score 01/18/2020 10/18/2019 09/17/2019  Nervous, Anxious, on Edge 1 1 1   Control/stop worrying 1 1 0  Worry too much - different things 1 1 1   Trouble relaxing 0 0 0  Restless 0 0 0  Easily annoyed or irritable 1 1 1   Afraid - awful might happen 1 1 1   Total GAD 7 Score 5 5 4   Anxiety Difficulty Somewhat difficult - Somewhat difficult     Depression screen [facility] 2/9 01/18/2020 11/23/2019 10/18/2019 09/17/2019  Decreased Interest 1 0 0 0  Down, Depressed, Hopeless 1 0 0 0  PHQ - 2 Score 2 0 0 0   Altered sleeping 0 - - -  Tired, decreased energy 1 - - -  Change in appetite 0 - - -  Feeling bad or failure about yourself  1 - - -  Trouble concentrating 0 - - -  Moving slowly or fidgety/restless 0 - - -  Suicidal thoughts 0 - - -  PHQ-9 Score 4 - - -      History There are no problems to display for this patient.  Past Medical History:  Diagnosis Date  . Substance abuse ([facility])    clean/sober since 08/24/2012   No past surgical history on file. No Known Allergies Prior to Admission medications   Medication Sig Start Date End Date Taking? Authorizing Provider  clindamycin-benzoyl peroxide (BENZACLIN) gel Apply 1 application topically every other day. 07/05/19  Yes [provider]  tretinoin (RETIN-A) 0.05 % cream Apply topically as directed. 07/05/19  Yes [provider]   Social History   Socioeconomic History  . Marital status: Single    Spouse name: n/a  . Number of children: 0  . Years of education: Not on file  . Highest education level: Not on file  Occupational History  . 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 Smoking status: Former Smoker    Packs/day: 0.50  Types: Cigarettes    Quit date: 04/13/2019    Years since quitting: 0.7  . Smokeless tobacco: Never Used  Substance and Sexual Activity  . Alcohol use: Yes    Comment: occasionally  . Drug use: No    Types: Benzodiazepines, Marijuana, Other-see comments    Comment: Clean since 08/24/2012 (psychedelics, opiods)  . Sexual activity: Not on file  Other Topics Concern  . Not on file  Social History Narrative   Lives with his parents.   Social Determinants of Health   Financial Resource Strain:   . Difficulty of Paying Living Expenses: Not on file  Food Insecurity:   . Worried About Programme researcher, broadcasting/film/video in the Last Year: Not on file  . Ran Out of Food in the Last Year: Not on file  Transportation Needs:   . Lack of Transportation (Medical): Not on file  .  Lack of Transportation (Non-Medical): Not on file  Physical Activity:   . Days of Exercise per Week: Not on file  . Minutes of Exercise per Session: Not on file  Stress:   . Feeling of Stress : Not on file  Social Connections:   . Frequency of Communication with Friends and Family: Not on file  . Frequency of Social Gatherings with Friends and Family: Not on file  . Attends Religious Services: Not on file  . Active Member of Clubs or Organizations: Not on file  . Attends Banker Meetings: Not on file  . Marital Status: Not on file  Intimate Partner Violence:   . Fear of Current or Ex-Partner: Not on file  . Emotionally Abused: Not on file  . Physically Abused: Not on file  . Sexually Abused: Not on file    Review of Systems   Objective:   Vitals:   01/18/20 1021  BP: 130/86  Pulse: 70  Temp: 98.3 F (36.8 C)  TempSrc: Temporal  SpO2: 96%  Weight: 174 lb (78.9 kg)  Height: 6' (1.829 m)     Physical Exam Vitals reviewed.  Constitutional:      General: He is not in acute distress.    Appearance: He is well-developed.  HENT:     Head: Normocephalic and atraumatic.  Neck:     Comments: No tm or nodule.  Cardiovascular:     Rate and Rhythm: Normal rate.  Pulmonary:     Effort: Pulmonary effort is normal.  Musculoskeletal:     Cervical back: Neck supple.  Neurological:     General: No focal deficit present.     Mental Status: He is alert and oriented to person, place, and time.  Psychiatric:        Mood and Affect: Mood normal.        Behavior: Behavior normal.     Assessment & Plan:  Rigel R Ebel is a 29 y.o. male . Depression with anxiety  - start zoloft low dose, potential side effects and risk of medicines discussed.  Do suspect he has a component of depression with anxiety.  Possible need for higher dose but will start 25 mg initially.  Also suspect marijuana use may be self treatment, anticipate decreased need with treatment of  depression/anxiety symptoms.  No orders of the defined types were placed in this encounter.  Patient Instructions       If you have lab work done today you will be contacted with your lab results within the next 2 weeks.  If you have not heard from [place]  then please contact us. The fastest way to get your results is to register for My Chart.   IF you received an x-ray today, you will receive an invoice from [facility]. Please contact [facility] at [phone] with questions or concerns regarding your invoice.   IF you received labwork today, you will receive an invoice from [place]. Please contact [facility] at [phone] with questions or concerns regarding your invoice.   Our billing staff will not be able to assist you with questions regarding bills from these companies.  You will be contacted with the lab results as soon as they are available. The fastest way to get your results is to activate your My Chart account. Instructions are located on the last page of this paperwork. If you have not heard from [place] regarding the results in 2 weeks, please contact this office.         Signed, Merri Ray, MD [facility]

## 2020-01-18 NOTE — Patient Instructions (Addendum)
Try zoloft once per day for now, recheck in 1 month.  Return to the clinic or go to the nearest emergency room if any of your symptoms worsen or new symptoms occur.   If you have lab work done today you will be contacted with your lab results within the next 2 weeks.  If you have not heard from [place] then please contact us. The fastest way to get your results is to register for My Chart.   IF you received an x-ray today, you will receive an invoice from [facility]. Please contact [facility] Radiology at [phone] with questions or concerns regarding your invoice.   IF you received labwork today, you will receive an invoice from [place]. Please contact [facility] at [phone] with questions or concerns regarding your invoice.   Our billing staff will not be able to assist you with questions regarding bills from these companies.  You will be contacted with the lab results as soon as they are available. The fastest way to get your results is to activate your My Chart account. Instructions are located on the last page of this paperwork. If you have not heard from [place] regarding the results in 2 weeks, please contact this office.     Managing Anxiety, Adult After being diagnosed with an anxiety disorder, you may be relieved to know why you have felt or behaved a certain way. You may also feel overwhelmed about the treatment ahead and what it will mean for your life. With care and support, you can manage this condition and recover from it. How to manage lifestyle changes Managing stress and anxiety  Stress is your body's reaction to life changes and events, both good and bad. Most stress will last just a few hours, but stress can be ongoing and can lead to more than just stress. Although stress can play a major role in anxiety, it is not the same as anxiety. Stress is usually caused by something external, such as a deadline, test, or competition. Stress normally passes after the triggering  event has ended.  Anxiety is caused by something internal, such as imagining a terrible outcome or worrying that something will go wrong that will devastate you. Anxiety often does not go away even after the triggering event is over, and it can become long-term (chronic) worry. It is important to understand the differences between stress and anxiety and to manage your stress effectively so that it does not lead to an anxious response. Talk with your health care provider or a counselor to learn more about reducing anxiety and stress. He or she may suggest tension reduction techniques, such as:  Music therapy. This can include creating or listening to music that you enjoy and that inspires you.  Mindfulness-based meditation. This involves being aware of your normal breaths while not trying to control your breathing. It can be done while sitting or walking.  Centering prayer. This involves focusing on a word, phrase, or sacred image that means something to you and brings you peace.  Deep breathing. To do this, expand your stomach and inhale slowly through your nose. Hold your breath for 3-5 seconds. Then exhale slowly, letting your stomach muscles relax.  Self-talk. This involves identifying thought patterns that lead to anxiety reactions and changing those patterns.  Muscle relaxation. This involves tensing muscles and then relaxing them. Choose a tension reduction technique that suits your lifestyle and personality. These techniques take time and practice. Set aside 5-15 minutes a day to do them.  Therapists can offer counseling and training in these techniques. The training to help with anxiety may be covered by some insurance plans. Other things you can do to manage stress and anxiety include:  Keeping a stress/anxiety diary. This can help you learn what triggers your reaction and then learn ways to manage your response.  Thinking about how you react to certain situations. You may not be able to  control everything, but you can control your response.  Making time for activities that help you relax and not feeling guilty about spending your time in this way.  Visual imagery and yoga can help you stay calm and relax.  Medicines Medicines can help ease symptoms. Medicines for anxiety include:  Anti-anxiety drugs.  Antidepressants. Medicines are often used as a primary treatment for anxiety disorder. Medicines will be prescribed by a health care provider. When used together, medicines, psychotherapy, and tension reduction techniques may be the most effective treatment. Relationships Relationships can play a big part in helping you recover. Try to spend more time connecting with trusted friends and family members. Consider going to couples counseling, taking family education classes, or going to family therapy. Therapy can help you and others better understand your condition. How to recognize changes in your anxiety Everyone responds differently to treatment for anxiety. Recovery from anxiety happens when symptoms decrease and stop interfering with your daily activities at home or work. This may mean that you will start to:  Have better concentration and focus. Worry will interfere less in your daily thinking.  Sleep better.  Be less irritable.  Have more energy.  Have improved memory. It is important to recognize when your condition is getting worse. Contact your health care provider if your symptoms interfere with home or work and you feel like your condition is not improving. Follow these instructions at home: Activity  Exercise. Most adults should do the following: ? Exercise for at least 150 minutes each week. The exercise should increase your heart rate and make you sweat (moderate-intensity exercise). ? Strengthening exercises at least twice a week.  Get the right amount and quality of sleep. Most adults need 7-9 hours of sleep each night. Lifestyle   Eat a healthy  diet that includes plenty of vegetables, fruits, whole grains, low-fat dairy products, and lean protein. Do not eat a lot of foods that are high in solid fats, added sugars, or salt.  Make choices that simplify your life.  Do not use any products that contain nicotine or tobacco, such as cigarettes, e-cigarettes, and chewing tobacco. If you need help quitting, ask your health care provider.  Avoid caffeine, alcohol, and certain over-the-counter cold medicines. These may make you feel worse. Ask your pharmacist which medicines to avoid. General instructions  Take over-the-counter and prescription medicines only as told by your health care provider.  Keep all follow-up visits as told by your health care provider. This is important. Where to find support You can get help and support from these sources:  Self-help groups.  Online and [employer].  A trusted spiritual leader.  Couples counseling.  Family education classes.  Family therapy. Where to find more information You may find that joining a support group helps you deal with your anxiety. The following sources can help you locate counselors or support groups near you:  Mental Health America: www.mentalhealthamerica.net  Anxiety and Depression Association of [place] (ADAA): ProgramCam.de  [employer] on Mental Illness (NAMI): www.nami.org Contact a health care provider if you:  Have a  hard time staying focused or finishing daily tasks.  Spend many hours a day feeling worried about everyday life.  Become exhausted by worry.  Start to have headaches, feel tense, or have nausea.  Urinate more than normal.  Have diarrhea. Get help right away if you have:  A racing heart and shortness of breath.  Thoughts of hurting yourself or others. If you ever feel like you may hurt yourself or others, or have thoughts about taking your own life, get help right away. You can go to your nearest emergency department  or call:  Your local emergency services (911 in the [place]  A suicide crisis helpline, such as the National Suicide Prevention Lifeline at 336-254-0690. This is open 24 hours a day. Summary  Taking steps to learn and use tension reduction techniques can help calm you and help prevent triggering an anxiety reaction.  When used together, medicines, psychotherapy, and tension reduction techniques may be the most effective treatment.  Family, friends, and partners can play a big part in helping you recover from an anxiety disorder. This information is not intended to replace advice given to you by your health care provider. Make sure you discuss any questions you have with your health care provider. Document Revised: 07/25/2018 Document Reviewed: 07/25/2018 Elsevier Patient Education  2020 [employer].

## 2020-02-14 ENCOUNTER — Telehealth: Payer: 59 | Admitting: Family Medicine

## 2020-02-14 ENCOUNTER — Other Ambulatory Visit: Payer: Self-pay

## 2020-02-14 ENCOUNTER — Encounter: Payer: Self-pay | Admitting: Family Medicine

## 2020-02-14 VITALS — Ht 72.0 in | Wt 170.0 lb

## 2020-02-14 DIAGNOSIS — J069 Acute upper respiratory infection, unspecified: Secondary | ICD-10-CM | POA: Diagnosis not present

## 2020-02-14 DIAGNOSIS — F418 Other specified anxiety disorders: Secondary | ICD-10-CM

## 2020-02-14 MED ORDER — SERTRALINE HCL 25 MG PO TABS: 25.0000 mg | ORAL_TABLET | Freq: Every day | ORAL | 1 refills | Status: DC

## 2020-02-14 NOTE — Patient Instructions (Addendum)
   Good talking to you today.  I am glad to hear the medication is working.  If you have continued side effects, or worsening symptoms please let me know and we can look at adjusting medications.  Let me know if the respiratory symptoms do not continue to improve.  Take care.   If you have lab work done today you will be contacted with your lab results within the next 2 weeks.  If you have not heard from [place] then please contact us. The fastest way to get your results is to register for My Chart.   IF you received an x-ray today, you will receive an invoice from [facility]. Please contact [facility] at [phone] with questions or concerns regarding your invoice.   IF you received labwork today, you will receive an invoice from [facility]. Please contact [facility] at [phone] with questions or concerns regarding your invoice.   Our billing staff will not be able to assist you with questions regarding bills from these companies.  You will be contacted with the lab results as soon as they are available. The fastest way to get your results is to activate your My Chart account. Instructions are located on the last page of this paperwork. If you have not heard from [place] regarding the results in 2 weeks, please contact this office.

## 2020-02-14 NOTE — Progress Notes (Signed)
Virtual Visit via Video Note  I connected with Philip Spencer on 02/14/20 at 5:27 PM by a video enabled telemedicine application and verified that I am speaking with the correct person using two identifiers.  Patient location:home  My location: office   I discussed the limitations, risks, security and privacy concerns of performing an evaluation and management service by telephone and the availability of in person appointments. I also discussed with the patient that there may be a patient responsible charge related to this service. The patient expressed understanding and agreed to proceed, consent obtained  Chief complaint:  Chief Complaint  Patient presents with  . Medication Refill    Pt states he needs a refill on his Zoloft. Pt reports this medication gave him some issues the first 2 weeks, but after the pt adjusted he states he is feeling fine.  Marland Kitchen URI    Pt was having cough, sore throat, headaches, and congestion. Pt first had symptoms on 02/06/2020. Pt had a COVID-19 test yesterday.     History of Present Illness: Philip Spencer is a 29 y.o. male  Depression/anxiety: Started on low-dose Zoloft 25 mg daily on November 12.  Marijuana use at that time, thought to be component of self treatment and anticipated decreased need with treatment of depression and anxiety symptoms.  He does note that the medication had some initial side effects - "outside self" but after the first few weeks has adjusted.  Mentally has been feeling well.  Some decreased ability with initiation of erection, arousal once.  Less anxious - more level.  No manic symptoms.  Has cut back on marijuana - about 3 times per week - no longer daily.  Has appt next week in [place] - Dr. Jodelle Red for Nuss procedure.   GAD 7 : Generalized Anxiety Score 02/14/2020 01/18/2020 10/18/2019 09/17/2019  Nervous, Anxious, on Edge 0 1 1 1   Control/stop worrying 0 1 1 0  Worry too much - different things 0 1 1 1   Trouble relaxing 0 0 0 0   Restless 0 0 0 0  Easily annoyed or irritable 0 1 1 1   Afraid - awful might happen 0 1 1 1   Total GAD 7 Score 0 5 5 4   Anxiety Difficulty - Somewhat difficult - Somewhat difficult   Depression screen [facility] 2/9 02/14/2020 01/18/2020 11/23/2019 10/18/2019 09/17/2019  Decreased Interest 0 1 0 0 0  Down, Depressed, Hopeless 0 1 0 0 0  PHQ - 2 Score 0 2 0 0 0  Altered sleeping 0 0 - - -  Tired, decreased energy 0 1 - - -  Change in appetite 0 0 - - -  Feeling bad or failure about yourself  0 1 - - -  Trouble concentrating 0 0 - - -  Moving slowly or fidgety/restless 0 0 - - -  Suicidal thoughts 0 0 - - -  PHQ-9 Score 0 4 - - -     Upper respiratory infection: Notes having a cough, sore throat, headache, nasal congestion. Initial symptoms 02/06/2020.mild initially. Some sweats at night. Low grade subjective fever. Feeling better overall since last weekend. Has been isolating.  No loss of taste/smell.  Had covid vaccine in March, April.  Had covid test yesterday at [facility], has not yet received results.  There are no problems to display for this patient.  Past Medical History:  Diagnosis Date  . Substance abuse (HCC)    clean/sober since 08/24/2012   No past surgical history  on file. No Known Allergies Prior to Admission medications   Medication Sig Start Date End Date Taking? Authorizing Provider  sertraline (ZOLOFT) 25 MG tablet Take 1 tablet (25 mg total) by mouth daily. 01/18/20  Yes Shade Flood, MD  tretinoin (RETIN-A) 0.05 % cream Apply topically as directed. 07/05/19  Yes [provider]  clindamycin-benzoyl peroxide (BENZACLIN) gel Apply 1 application topically every other day. Patient not taking: Reported on 02/14/2020 07/05/19   [provider]   Social History   Socioeconomic History  . Marital status: Single    Spouse name: n/a  . Number of children: 0  . Years of education: Not on file  . Highest education level: Not on file  Occupational History   . Occupation: Airline pilot +    Comment: e-[employer] store  Tobacco Use  . Smoking status: Former Smoker    Packs/day: 0.50    Types: Cigarettes    Quit date: 04/13/2019    Years since quitting: 0.8  . Smokeless tobacco: Never Used  Substance and Sexual Activity  . Alcohol use: Yes    Comment: occasionally  . Drug use: No    Types: Benzodiazepines, Marijuana, Other-see comments    Comment: Clean since 08/24/2012 (psychedelics, opiods)  . Sexual activity: Not on file  Other Topics Concern  . Not on file  Social History Narrative   Lives with his parents.   Social Determinants of Health   Financial Resource Strain: Not on file  Food Insecurity: Not on file  Transportation Needs: Not on file  Physical Activity: Not on file  Stress: Not on file  Social Connections: Not on file  Intimate Partner Violence: Not on file    Observations/Objective: Vitals:   02/14/20 1403  Weight: 170 lb (77.1 kg)  Height: 6' (1.829 m)   Speaking full sentences, no respiratory distress, nontoxic appearance.  Appropriate responses.  Euthymic mood with affect mood congruent.  All questions were answered with understanding of plan expressed.  Assessment and Plan: Depression with anxiety - Plan: sertraline (ZOLOFT) 25 MG tablet  -Improved symptoms, tolerating current dose of sertraline with minimal sexual side effects.  Continue same dose for now, recheck 6 months unless worsening sexual side effects or need for increased dosing/increased anxiety or depression symptoms.  Acute upper respiratory infection  -Improving symptoms, Covid testing performed yesterday, waiting on results.  10-day isolation discussed as long as he is improving and afebrile.  Has been vaccinated but not yet received booster, plans on scheduling booster depending on test results as well has symptom improvement.  RTC/ER precautions.  Follow Up Instructions:    Patient Instructions     Good talking to you today.  I am glad to hear the  medication is working.  If you have continued side effects, or worsening symptoms please let me know and we can look at adjusting medications.  Let me know if the respiratory symptoms do not continue to improve.  Take care.   If you have lab work done today you will be contacted with your lab results within the next 2 weeks.  If you have not heard from [place] then please contact us. The fastest way to get your results is to register for My Chart.   IF you received an x-ray today, you will receive an invoice from [facility]. Please contact [facility] at [phone] with questions or concerns regarding your invoice.   IF you received labwork today, you will receive an invoice from [employer]. Please contact [facility]  at [phone] with questions or concerns regarding your invoice.   Our billing staff will not be able to assist you with questions regarding bills from these companies.  You will be contacted with the lab results as soon as they are available. The fastest way to get your results is to activate your My Chart account. Instructions are located on the last page of this paperwork. If you have not heard from [place] regarding the results in 2 weeks, please contact this office.       I discussed the assessment and treatment plan with the patient. The patient was provided an opportunity to ask questions and all were answered. The patient agreed with the plan and demonstrated an understanding of the instructions.   The patient was advised to call back or seek an in-person evaluation if the symptoms worsen or if the condition fails to improve as anticipated.  I provided 16 minutes of non-face-to-face time during this encounter.   Shade Flood, MD

## 2020-03-13 ENCOUNTER — Other Ambulatory Visit: Payer: Self-pay

## 2020-03-13 ENCOUNTER — Encounter: Payer: Self-pay | Admitting: Family Medicine

## 2020-03-13 ENCOUNTER — Telehealth: Payer: 59 | Admitting: Family Medicine

## 2020-03-13 DIAGNOSIS — B349 Viral infection, unspecified: Secondary | ICD-10-CM

## 2020-03-13 NOTE — Patient Instructions (Addendum)
  Isolate for 5 days.  If symptoms are substantially improved he can return to work wearing a mask (suggested double masking).  He can treat himself with OTC cough and cold medications and Tylenol ibuprofen.  Call if at all worse.  He will come for the drive up testing and it will probably take a couple of days to get the results.  According to the chart he does have my chart.   If you have lab work done today you will be contacted with your lab results within the next 2 weeks.  If you have not heard from [place] then please contact us. The fastest way to get your results is to register for My Chart.   IF you received an x-ray today, you will receive an invoice from [facility]. Please contact [facility] Radiology at [phone] with questions or concerns regarding your invoice.   IF you received labwork today, you will receive an invoice from [facility]. Please contact [facility] at [phone] with questions or concerns regarding your invoice.   Our billing staff will not be able to assist you with questions regarding bills from these companies.  You will be contacted with the lab results as soon as they are available. The fastest way to get your results is to activate your My Chart account. Instructions are located on the last page of this paperwork. If you have not heard from [place] regarding the results in 2 weeks, please contact this office.

## 2020-03-13 NOTE — Progress Notes (Signed)
Patient ID: Philip Spencer, male    DOB: 07-13-1990  Age: 30 y.o. MRN: [id number]  Chief Complaint  Patient presents with  . Sore Throat    Pt reports cough, sore throat, congestion, since Tuesday evening. Pt notes he feels feverish with chills cannot confirm no thermometer     Subjective:   Telemedicine visit.  Patient understands the limitations of a telephone visit.  He is appropriately identified, speaking to him from his home and me from the office here.  We had a 15-minute talk.  He understands that the fact that insurance gets billed and he is already paid his co-pay.  Dr. Chilton Si is a regular doctor.  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  Tuesday he got ill.  He started with a sore throat, but then it has gone more to his head and and to his chest.  He is coughing a lot.  He has been having chills.  He is pretty convinced that he has COVID.  He lives alone.  He has been vaccinated with 2 Pfizer vaccines but has not had the booster.  He is self treating himself at home.  He does not feel like he is getting worse, in fact he feels like he probably is starting to get on the mend.  We discussed the value of getting the swab and it has been ordered. Current allergies, medications, problem list, past/family and social histories reviewed.  Objective:  There were no vitals taken for this visit.  Unable to examine him.  Advised he consider getting a thermometer and access to a pulse oximeter if possible. Assessment & Plan:   Assessment: 1. Acute viral syndrome       Plan: Probably COVID.  Will test for flu and RSV also.  He did not feel like he needed any prescription cough medications at this time.  He will come to the drive up testing at the office.  Orders Placed This Encounter  Procedures  . COVID-19, Flu A+B and RSV    Order Specific Question:   Is this test for diagnosis or screening    Answer:    Diagnosis of ill patient    Order Specific Question:   Symptomatic for COVID-19 as defined by CDC    Answer:   Yes    Order Specific Question:   Date of Symptom Onset    Answer:   03/11/2020    Order Specific Question:   Hospitalized for COVID-19    Answer:   No    Order Specific Question:   Admitted to ICU for COVID-19    Answer:   No    Order Specific Question:   Previously tested for COVID-19    Answer:   No    Order Specific Question:   Resident in a congregate (group) care setting    Answer:   No    Order Specific Question:   Is the patient student?    Answer:   No    Order Specific Question:   Employed in healthcare setting    Answer:   No    Order Specific Question:   Has patient completed COVID vaccination(s) (2 doses of Pfizer/Moderna 1 dose of [employer])    Answer:   Yes    Order Specific Question:   Release to patient    Answer:   Immediate    No orders of  the defined types were placed in this encounter.        Patient Instructions    Isolate for 5 days.  If symptoms are substantially improved he can return to work wearing a mask (suggested double masking).  He can treat himself with OTC cough and cold medications and Tylenol ibuprofen.  Call if at all worse.  He will come for the drive up testing and it will probably take a couple of days to get the results.  According to the chart he does have my chart.   If you have lab work done today you will be contacted with your lab results within the next 2 weeks.  If you have not heard from [place] then please contact us. The fastest way to get your results is to register for My Chart.   IF you received an x-ray today, you will receive an invoice from [facility]. Please contact [facility] at [phone] with questions or concerns regarding your invoice.   IF you received labwork today, you will receive an invoice from [place]. Please contact [facility] at [phone] with questions or concerns  regarding your invoice.   Our billing staff will not be able to assist you with questions regarding bills from these companies.  You will be contacted with the lab results as soon as they are available. The fastest way to get your results is to activate your My Chart account. Instructions are located on the last page of this paperwork. If you have not heard from [place] regarding the results in 2 weeks, please contact this office.        Return if symptoms worsen or fail to improve.   Janace Hoard, MD 03/13/2020

## 2020-03-15 LAB — COVID-19, FLU A+B AND RSV
Influenza A, NAA: NOT DETECTED
Influenza B, NAA: NOT DETECTED
RSV, NAA: NOT DETECTED
SARS-CoV-2, NAA: DETECTED — AB

## 2020-04-22 ENCOUNTER — Encounter: Payer: Self-pay | Admitting: Family Medicine

## 2020-04-30 ENCOUNTER — Encounter: Payer: Self-pay | Admitting: Family Medicine

## 2020-04-30 ENCOUNTER — Telehealth: Payer: 59 | Admitting: Family Medicine

## 2020-04-30 ENCOUNTER — Other Ambulatory Visit: Payer: Self-pay

## 2020-04-30 VITALS — Ht 72.0 in | Wt 165.0 lb

## 2020-04-30 DIAGNOSIS — N529 Male erectile dysfunction, unspecified: Secondary | ICD-10-CM

## 2020-04-30 DIAGNOSIS — F418 Other specified anxiety disorders: Secondary | ICD-10-CM

## 2020-04-30 MED ORDER — SILDENAFIL CITRATE 50 MG PO TABS: 25.0000 mg | ORAL_TABLET | Freq: Every day | ORAL | 2 refills | Status: DC | PRN

## 2020-04-30 MED ORDER — SERTRALINE HCL 25 MG PO TABS: 25.0000 mg | ORAL_TABLET | Freq: Every day | ORAL | 1 refills | Status: DC

## 2020-04-30 NOTE — Patient Instructions (Addendum)
  Can try lowest effective dose of Viagra temporarily as needed but if you continue to need that medication I would recommend other testing and trial of Wellbutrin in place of Zoloft.  Let me know how things are going in the next 6 weeks.  I did refill Zoloft same dose for now.   If you have lab work done today you will be contacted with your lab results within the next 2 weeks.  If you have not heard from [place] then please contact us. The fastest way to get your results is to register for My Chart.   IF you received an x-ray today, you will receive an invoice from [facility]. Please contact [facility] at [phone] with questions or concerns regarding your invoice.   IF you received labwork today, you will receive an invoice from [facility]. Please contact [facility] at [phone] with questions or concerns regarding your invoice.   Our billing staff will not be able to assist you with questions regarding bills from these companies.  You will be contacted with the lab results as soon as they are available. The fastest way to get your results is to activate your My Chart account. Instructions are located on the last page of this paperwork. If you have not heard from [place] regarding the results in 2 weeks, please contact this office.

## 2020-04-30 NOTE — Progress Notes (Signed)
Virtual Visit via Telephone Note  I called Philip Spencer on 04/30/20 at 6:54 PM  by telephone and verified that I am speaking with the correct person using two identifiers. Patient location: home My location: office.    I discussed the limitations, risks, security and privacy concerns of performing an evaluation and management service by telephone and the availability of in person appointments. I also discussed with the patient that there may be a patient responsible charge related to this service. The patient expressed understanding and agreed to proceed, consent obtained  Chief complaint:  Chief Complaint  Patient presents with  . Medication Reaction    Pt reports he has noticed when he takes his zoloft it lowers his sex drive. Pt is also wanting a refill due to going on vacation soon. No other side effect to report.    History of Present Illness: Philip Spencer is a 30 y.o. male  Depression: See mychart Message regarding decreased libido - for awhile - noted some prior to zoloft, seems worse on zoloft. Able to achieve normal morning erections, but difficulty with erections at other    Depression screen [facility] 2/9 04/30/2020 03/13/2020 02/14/2020 01/18/2020 11/23/2019  Decreased Interest 0 0 0 1 0  Down, Depressed, Hopeless 0 0 0 1 0  PHQ - 2 Score 0 0 0 2 0  Altered sleeping - - 0 0 -  Tired, decreased energy - - 0 1 -  Change in appetite - - 0 0 -  Feeling bad or failure about yourself  - - 0 1 -  Trouble concentrating - - 0 0 -  Moving slowly or fidgety/restless - - 0 0 -  Suicidal thoughts - - 0 0 -  PHQ-9 Score - - 0 4 -   GAD 7 : Generalized Anxiety Score 02/14/2020 01/18/2020 10/18/2019 09/17/2019  Nervous, Anxious, on Edge 0 1 1 1   Control/stop worrying 0 1 1 0  Worry too much - different things 0 1 1 1   Trouble relaxing 0 0 0 0  Restless 0 0 0 0  Easily annoyed or irritable 0 1 1 1   Afraid - awful might happen 0 1 1 1   Total GAD 7 Score 0 5 5 4   Anxiety Difficulty -  Somewhat difficult - Somewhat difficult       There are no problems to display for this patient.  Past Medical History:  Diagnosis Date  . Substance abuse (HCC)    clean/sober since 08/24/2012   No past surgical history on file. No Known Allergies Prior to Admission medications   Medication Sig Start Date End Date Taking? Authorizing Provider  clindamycin-benzoyl peroxide (BENZACLIN) gel Apply 1 application topically every other day. 07/05/19  Yes [provider]  sertraline (ZOLOFT) 25 MG tablet Take 1 tablet (25 mg total) by mouth daily. 02/14/20  Yes , MD  tretinoin (RETIN-A) 0.05 % cream Apply topically as directed. 07/05/19  Yes [provider]   Social History   Socioeconomic History  . Marital status: Single    Spouse name: n/a  . Number of children: 0  . Years of education: Not on file  . Highest education level: Not on file  Occupational History  . 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 Smoking status: Former Smoker    Packs/day: 0.50    Types: Cigarettes    Quit date: 04/13/2019    Years since quitting: 1.0  . Smokeless  tobacco: Never Used  Substance and Sexual Activity  . Alcohol use: Yes    Comment: occasionally  . Drug use: No    Types: Benzodiazepines, Marijuana, Other-see comments    Comment: Clean since 08/24/2012 (psychedelics, opiods)  . Sexual activity: Not on file  Other Topics Concern  . Not on file  Social History Narrative   Lives with his parents.   Social Determinants of Health   Financial Resource Strain: Not on file  Food Insecurity: Not on file  Transportation Needs: Not on file  Physical Activity: Not on file  Stress: Not on file  Social Connections: Not on file  Intimate Partner Violence: Not on file     Observations/Objective: Vitals:   04/30/20 1519  Weight: 165 lb (74.8 kg)  Height: 6' (1.829 m)   No distress, all questions answered, understanding of plan  expressed Assessment and Plan: Erectile dysfunction, unspecified erectile dysfunction type - Plan: sildenafil (VIAGRA) 50 MG tablet  Depression with anxiety - Plan: sertraline (ZOLOFT) 25 MG tablet Depression/anxiety has been stable, overall tolerating Zoloft with possible worsening of previous erectile dysfunction/sexual side effects.  Discussed option of switch to Wellbutrin as already on low-dose Zoloft and no change in symptoms after being on it for some time.  He would like to try low-dose Viagra initially short-term, but then if persistent need would be agreeable to change to Wellbutrin.  Update by MyChart in the next 6 weeks.  Would consider further testing if persistent erectile dysfunction on Wellbutrin (if we do make that change)  Follow Up Instructions: 6 months for meds, 6 weeks my chart update.   I discussed the assessment and treatment plan with the patient. The patient was provided an opportunity to ask questions and all were answered. The patient agreed with the plan and demonstrated an understanding of the instructions.   The patient was advised to call back or seek an in-person evaluation if the symptoms worsen or if the condition fails to improve as anticipated.  I provided 11 minutes of non-face-to-face time during this encounter.  Signed,   Meredith Staggers, MD Primary Care at [facility].  04/30/20

## 2020-05-02 ENCOUNTER — Telehealth: Payer: Self-pay | Admitting: Family Medicine

## 2020-05-02 NOTE — Telephone Encounter (Signed)
05/02/2020 - PATIENT HAD A VIRTUAL APPOINTMENT WITH DR. Neva Seat ON Wednesday (04/30/2020). DR. Neva Seat REQUESTED HE RETURN FOR FOLLOW-UP IN 6 MONTHS. I CALLED TO SCHEDULE BUT HE WANTS TO CALL BACK AT A LATER DATE TO SCHEDULE. HE IS AWARE OF OUR OFFICE CLOSING IN [facility]. I FOR GOT TO ASK HIM IF HE WANTED ME TO CANCEL THE APPOINTMENT HE HAS ALREADY FOR 08/14/2020 THAT WAS A 6 MONTH FOLLOW-UP MADE IN DEC. 2021. I HAD TO LEAVE HIM A VOICE MAIL TO RETURN MY CALL. MBC

## 2020-08-14 ENCOUNTER — Ambulatory Visit: Payer: Self-pay | Admitting: Family Medicine

## 2020-08-28 ENCOUNTER — Telehealth: Payer: Self-pay

## 2020-08-28 ENCOUNTER — Encounter: Payer: Self-pay | Admitting: Family Medicine

## 2020-08-28 NOTE — Telephone Encounter (Signed)
Patient has dropped off a series of scripts from a surgeon he will be seeing in [place].   States the Surgeon is not covered under his insurance.  Therefore the surgeon is requesting Dr. Neva Seat to help with some pre op procedures before surgery.    I have placed these in Dr. Paralee Cancel box.

## 2020-08-29 NOTE — Telephone Encounter (Signed)
Noted Will review at upcoming visit 

## 2020-09-01 ENCOUNTER — Other Ambulatory Visit: Payer: Self-pay

## 2020-09-01 ENCOUNTER — Ambulatory Visit (INDEPENDENT_AMBULATORY_CARE_PROVIDER_SITE_OTHER): Payer: 59 | Admitting: Family Medicine

## 2020-09-01 VITALS — BP 128/76 | HR 72 | Temp 98.3°F | Resp 16 | Ht 72.0 in | Wt 173.4 lb

## 2020-09-01 DIAGNOSIS — N529 Male erectile dysfunction, unspecified: Secondary | ICD-10-CM

## 2020-09-01 DIAGNOSIS — Q676 Pectus excavatum: Secondary | ICD-10-CM

## 2020-09-01 DIAGNOSIS — F418 Other specified anxiety disorders: Secondary | ICD-10-CM | POA: Diagnosis not present

## 2020-09-01 NOTE — Progress Notes (Signed)
Subjective:  Patient ID: Philip Spencer, male    DOB: [birth year]  Age: 30 y.o. MRN: [id number]  CC:  Chief Complaint  Patient presents with   Depression    Pt reports medication has worked well and would like to consider weaning or lowering the dose to come off.     HPI Philip Spencer presents for   Depression with anxiety See previous notes.  Some difficulty with erectile dysfunction on Zoloft.  Depression has been well controlled.  Ultimately discussed options of switching to Wellbutrin but decided on low-dose Viagra short-term.  If persistent need agreed to change to Wellbutrin. He is doing well, and would like to taper off sertraline. Took 1/4 viagra tablet. Worked well but  Feels evened out - sometimes "too even" felt like more anxiety symptoms prior.  Exercising more, new dog in December.  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  Cut back on marijuana.  Depression screen [facility] 2/9 09/01/2020 04/30/2020 03/13/2020 02/14/2020 01/18/2020  Decreased Interest 0 0 0 0 1  Down, Depressed, Hopeless 0 0 0 0 1  PHQ - 2 Score 0 0 0 0 2  Altered sleeping 1 - - 0 0  Tired, decreased energy 0 - - 0 1  Change in appetite 0 - - 0 0  Feeling bad or failure about yourself  1 - - 0 1  Trouble concentrating 0 - - 0 0  Moving slowly or fidgety/restless 0 - - 0 0  Suicidal thoughts 0 - - 0 0  PHQ-9 Score 2 - - 0 4   GAD 7 : Generalized Anxiety Score 09/01/2020 02/14/2020 01/18/2020 10/18/2019  Nervous, Anxious, on Edge 0 0 1 1  Control/stop worrying 0 0 1 1  Worry too much - different things 1 0 1 1  Trouble relaxing 0 0 0 0  Restless 0 0 0 0  Easily annoyed or irritable 0 0 1 1  Afraid - awful might happen 0 0 1 1  Total GAD 7 Score 1 0 5 5  Anxiety Difficulty - - Somewhat difficult -   Planning on pectus surgery. Dr. Herbie Drape in [place]. Workup needs to be performed within 5 months of surgery. Eval will need to be done locally due to insurance coverage - he dropped off testing prescription paperwork.  Would like to start workup as soon as possible.   History There are no problems to display for this patient.  Past Medical History:  Diagnosis Date   Substance abuse (HCC)    clean/sober since 08/24/2012   No past surgical history on file. No Known Allergies Prior to Admission medications   Medication Sig Start Date End Date Taking? Authorizing Provider  clindamycin-benzoyl peroxide (BENZACLIN) gel Apply 1 application topically every other day. 07/05/19  Yes [provider]  sertraline (ZOLOFT) 25 MG tablet Take 1 tablet (25 mg total) by mouth daily. 04/30/20  Yes Shade Flood, MD  sildenafil (VIAGRA) 50 MG tablet Take 0.5-1 tablets (25-50 mg total) by mouth daily as needed for erectile dysfunction. 04/30/20  Yes Shade Flood, MD  tretinoin (RETIN-A) 0.05 % cream Apply topically as directed. 07/05/19  Yes [provider]   Social History   Socioeconomic History   Marital status: Single    Spouse name: n/a   Number of children: 0   Years of education: Not on file   Highest education level: Not on file  Occupational History   Occupation: sales +    Comment: [employer]  Tobacco Use   Smoking status: Former    Packs/day: 0.50    Pack years: 0.00    Types: Cigarettes    Quit date: 04/13/2019    Years since quitting: 1.3   Smokeless tobacco: Never  Substance and Sexual Activity   Alcohol use: Yes    Comment: occasionally   Drug use: No    Types: Benzodiazepines, Marijuana, Other-see comments    Comment: Clean since 08/24/2012 (psychedelics, opiods)   Sexual activity: Not on file  Other Topics Concern   Not on file  Social History Narrative   Lives with his parents.   Social Determinants of Health   Financial Resource Strain: Not on file  Food Insecurity: Not on file  Transportation Needs: Not on file  Physical Activity: Not on file  Stress: Not on file  Social Connections: Not on file  Intimate Partner Violence: Not on file    Review of  Systems Per HPI  Objective:   Vitals:   09/01/20 1548  BP: 128/76  Pulse: 72  Resp: 16  Temp: 98.3 F (36.8 C)  TempSrc: Temporal  SpO2: 96%  Weight: 173 lb 6.4 oz (78.7 kg)  Height: 6' (1.829 m)     Physical Exam Constitutional:      General: He is not in acute distress.    Appearance: Normal appearance. He is well-developed.  HENT:     Head: Normocephalic and atraumatic.  Cardiovascular:     Rate and Rhythm: Normal rate.  Pulmonary:     Effort: Pulmonary effort is normal.  Neurological:     Mental Status: He is alert and oriented to person, place, and time.  Psychiatric:        Mood and Affect: Mood normal.       Assessment & Plan:  Philip Spencer is a 30 y.o. male . Depression with anxiety  Pectus excavatum  Erectile dysfunction, unspecified erectile dysfunction type  Anxiety/mood symptoms have significantly improved.  On low-dose sertraline with some sexual side effects as above.  Discussed tapering off medication.  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  Tapering regimen discussed although low-dose currently.  If restart of medication needed, would determine if more depression versus any anxiety symptoms and consider either Wellbutrin or BuSpar.  Continue coping techniques, other management of anxiety with handout given.  Will review paperwork for needed testing to clear/prep for pectus excavatum surgery.  Potentially may need local cardiology evaluation.  No orders of the defined types were placed in this encounter.  Patient Instructions  You can stay on zoloft through the real estate class, then taper to 1/2 pill per day for 1 week, 1/2 pill every other day for a week, then stop.   If return of anxiety symptoms, we can try a low dose of Buspar to see if that is better tolerated. If depression symptoms, then we have other option - possibly wellbutrin.   Continue exercise and other anxiety self management as  below.   Depending on needed tests for pectus surgery, I may need to refer you to a specialist for some of these tests. I will let you know once I review paperwork.   Managing Anxiety, Adult After being diagnosed with an anxiety disorder, you may be relieved to know why you have felt or behaved a certain way. You may also feel overwhelmed about the treatment ahead and what it will mean for your life. With care and support,  youcan manage this condition and recover from it. How to manage lifestyle changes Managing stress and anxiety  Stress is your body's reaction to life changes and events, both good and bad. Most stress will last just a few hours, but stress can be ongoing and can lead to more than just stress. Although stress can play a major role in anxiety, it is not the same as anxiety. Stress is usually caused by something external, such as a deadline, test, or competition. Stress normally passes after thetriggering event has ended.  Anxiety is caused by something internal, such as imagining a terrible outcome or worrying that something will go wrong that will devastate you. Anxiety often does not go away even after the triggering event is over, and it can become long-term (chronic) worry. It is important to understand the differences between stress and anxiety and to manage your stress effectively so that it does not lead to ananxious response. Talk with your health care provider or a counselor to learn more about reducing anxiety and stress. He or she may suggest tension reduction techniques, such as: Music therapy. This can include creating or listening to music that you enjoy and that inspires you. Mindfulness-based meditation. This involves being aware of your normal breaths while not trying to control your breathing. It can be done while sitting or walking. Centering prayer. This involves focusing on a word, phrase, or sacred image that means something to you and brings you peace. Deep  breathing. To do this, expand your stomach and inhale slowly through your nose. Hold your breath for 3-5 seconds. Then exhale slowly, letting your stomach muscles relax. Self-talk. This involves identifying thought patterns that lead to anxiety reactions and changing those patterns. Muscle relaxation. This involves tensing muscles and then relaxing them. Choose a tension reduction technique that suits your lifestyle and personality. These techniques take time and practice. Set aside 5-15 minutes a day to do them. Therapists can offer counseling and training in these techniques. The training to help with anxiety may be covered by some insurance plans. Other things you can do to manage stress and anxiety include: Keeping a stress/anxiety diary. This can help you learn what triggers your reaction and then learn ways to manage your response. Thinking about how you react to certain situations. You may not be able to control everything, but you can control your response. Making time for activities that help you relax and not feeling guilty about spending your time in this way. Visual imagery and yoga can help you stay calm and relax.  Medicines Medicines can help ease symptoms. Medicines for anxiety include: Anti-anxiety drugs. Antidepressants. Medicines are often used as a primary treatment for anxiety disorder. Medicines will be prescribed by a health care provider. When used together, medicines, psychotherapy, and tension reduction techniques may be the most effectivetreatment. Relationships Relationships can play a big part in helping you recover. Try to spend more time connecting with trusted friends and family members. Consider going to couples counseling, taking family education classes, or going to familytherapy. Therapy can help you and others better understand your condition. How to recognize changes in your anxiety Everyone responds differently to treatment for anxiety. Recovery from anxiety  happens when symptoms decrease and stop interfering with your daily activities at home or work. This may mean that you will start to: Have better concentration and focus. Worry will interfere less in your daily thinking. Sleep better. Be less irritable. Have more energy. Have improved memory. It is important to  recognize when your condition is getting worse. Contact your health care provider if your symptoms interfere with home or work and you feellike your condition is not improving. Follow these instructions at home: Activity Exercise. Most adults should do the following: Exercise for at least 150 minutes each week. The exercise should increase your heart rate and make you sweat (moderate-intensity exercise). Strengthening exercises at least twice a week. Get the right amount and quality of sleep. Most adults need 7-9 hours of sleep each night. Lifestyle  Eat a healthy diet that includes plenty of vegetables, fruits, whole grains, low-fat dairy products, and lean protein. Do not eat a lot of foods that are high in solid fats, added sugars, or salt. Make choices that simplify your life. Do not use any products that contain nicotine or tobacco, such as cigarettes, e-cigarettes, and chewing tobacco. If you need help quitting, ask your health care provider. Avoid caffeine, alcohol, and certain over-the-counter cold medicines. These may make you feel worse. Ask your pharmacist which medicines to avoid.  General instructions Take over-the-counter and prescription medicines only as told by your health care provider. Keep all follow-up visits as told by your health care provider. This is important. Where to find support You can get help and support from these sources: Self-help groups. Online and [employer] organizations. A trusted spiritual leader. Couples counseling. Family education classes. Family therapy. Where to find more information You may find that joining a support group helps you  deal with your anxiety. The following sources can help you locate counselors or support groups near you: Mental Health America: www.mentalhealthamerica.net Anxiety and Depression Association of [place] (ADAA): ProgramCam.dewww.adaa.org The First Americanational Alliance on Mental Illness (NAMI): www.nami.org Contact a health care provider if you: Have a hard time staying focused or finishing daily tasks. Spend many hours a day feeling worried about everyday life. Become exhausted by worry. Start to have headaches, feel tense, or have nausea. Urinate more than normal. Have diarrhea. Get help right away if you have: A racing heart and shortness of breath. Thoughts of hurting yourself or others. If you ever feel like you may hurt yourself or others, or have thoughts about taking your own life, get help right away. You can go to your nearest emergency department or call: Your local emergency services (911 in the [place] A suicide crisis helpline, such as the National Suicide Prevention Lifeline at 48468243151-2237154418. This is open 24 hours a day. Summary Taking steps to learn and use tension reduction techniques can help calm you and help prevent triggering an anxiety reaction. When used together, medicines, psychotherapy, and tension reduction techniques may be the most effective treatment. Family, friends, and partners can play a big part in helping you recover from an anxiety disorder. This information is not intended to replace advice given to you by your health care provider. Make sure you discuss any questions you have with your healthcare provider. Document Revised: 07/25/2018 Document Reviewed: 07/25/2018 Elsevier Patient Education  2022 Elsevier Inc.    Signed,   Meredith StaggersJeffrey Tzvi Economou, MD [facility], [facility] 09/02/20 5:17 PM

## 2020-09-01 NOTE — Patient Instructions (Addendum)
You can stay on zoloft through the real estate class, then taper to 1/2 pill per day for 1 week, 1/2 pill every other day for a week, then stop.   If return of anxiety symptoms, we can try a low dose of Buspar to see if that is better tolerated. If depression symptoms, then we have other option - possibly wellbutrin.   Continue exercise and other anxiety self management as below.   Depending on needed tests for pectus surgery, I may need to refer you to a specialist for some of these tests. I will let you know once I review paperwork.   Managing Anxiety, Adult After being diagnosed with an anxiety disorder, you may be relieved to know why you have felt or behaved a certain way. You may also feel overwhelmed about the treatment ahead and what it will mean for your life. With care and support, youcan manage this condition and recover from it. How to manage lifestyle changes Managing stress and anxiety  Stress is your body's reaction to life changes and events, both good and bad. Most stress will last just a few hours, but stress can be ongoing and can lead to more than just stress. Although stress can play a major role in anxiety, it is not the same as anxiety. Stress is usually caused by something external, such as a deadline, test, or competition. Stress normally passes after thetriggering event has ended.  Anxiety is caused by something internal, such as imagining a terrible outcome or worrying that something will go wrong that will devastate you. Anxiety often does not go away even after the triggering event is over, and it can become long-term (chronic) worry. It is important to understand the differences between stress and anxiety and to manage your stress effectively so that it does not lead to ananxious response. Talk with your health care provider or a counselor to learn more about reducing anxiety and stress. He or she may suggest tension reduction techniques, such as: Music therapy. This can  include creating or listening to music that you enjoy and that inspires you. Mindfulness-based meditation. This involves being aware of your normal breaths while not trying to control your breathing. It can be done while sitting or walking. Centering prayer. This involves focusing on a word, phrase, or sacred image that means something to you and brings you peace. Deep breathing. To do this, expand your stomach and inhale slowly through your nose. Hold your breath for 3-5 seconds. Then exhale slowly, letting your stomach muscles relax. Self-talk. This involves identifying thought patterns that lead to anxiety reactions and changing those patterns. Muscle relaxation. This involves tensing muscles and then relaxing them. Choose a tension reduction technique that suits your lifestyle and personality. These techniques take time and practice. Set aside 5-15 minutes a day to do them. Therapists can offer counseling and training in these techniques. The training to help with anxiety may be covered by some insurance plans. Other things you can do to manage stress and anxiety include: Keeping a stress/anxiety diary. This can help you learn what triggers your reaction and then learn ways to manage your response. Thinking about how you react to certain situations. You may not be able to control everything, but you can control your response. Making time for activities that help you relax and not feeling guilty about spending your time in this way. Visual imagery and yoga can help you stay calm and relax.  Medicines Medicines can help ease symptoms. Medicines for  anxiety include: Anti-anxiety drugs. Antidepressants. Medicines are often used as a primary treatment for anxiety disorder. Medicines will be prescribed by a health care provider. When used together, medicines, psychotherapy, and tension reduction techniques may be the most effectivetreatment. Relationships Relationships can play a big part in helping  you recover. Try to spend more time connecting with trusted friends and family members. Consider going to couples counseling, taking family education classes, or going to familytherapy. Therapy can help you and others better understand your condition. How to recognize changes in your anxiety Everyone responds differently to treatment for anxiety. Recovery from anxiety happens when symptoms decrease and stop interfering with your daily activities at home or work. This may mean that you will start to: Have better concentration and focus. Worry will interfere less in your daily thinking. Sleep better. Be less irritable. Have more energy. Have improved memory. It is important to recognize when your condition is getting worse. Contact your health care provider if your symptoms interfere with home or work and you feellike your condition is not improving. Follow these instructions at home: Activity Exercise. Most adults should do the following: Exercise for at least 150 minutes each week. The exercise should increase your heart rate and make you sweat (moderate-intensity exercise). Strengthening exercises at least twice a week. Get the right amount and quality of sleep. Most adults need 7-9 hours of sleep each night. Lifestyle  Eat a healthy diet that includes plenty of vegetables, fruits, whole grains, low-fat dairy products, and lean protein. Do not eat a lot of foods that are high in solid fats, added sugars, or salt. Make choices that simplify your life. Do not use any products that contain nicotine or tobacco, such as cigarettes, e-cigarettes, and chewing tobacco. If you need help quitting, ask your health care provider. Avoid caffeine, alcohol, and certain over-the-counter cold medicines. These may make you feel worse. Ask your pharmacist which medicines to avoid.  General instructions Take over-the-counter and prescription medicines only as told by your health care provider. Keep all  follow-up visits as told by your health care provider. This is important. Where to find support You can get help and support from these sources: Self-help groups. Online and [employer]. A trusted spiritual leader. Couples counseling. Family education classes. Family therapy. Where to find more information You may find that joining a support group helps you deal with your anxiety. The following sources can help you locate counselors or support groups near you: Mental Health America: www.mentalhealthamerica.net Anxiety and Depression Association of [place] (ADAA): ProgramCam.de The [employer] on Mental Illness (NAMI): www.nami.org Contact a health care provider if you: Have a hard time staying focused or finishing daily tasks. Spend many hours a day feeling worried about everyday life. Become exhausted by worry. Start to have headaches, feel tense, or have nausea. Urinate more than normal. Have diarrhea. Get help right away if you have: A racing heart and shortness of breath. Thoughts of hurting yourself or others. If you ever feel like you may hurt yourself or others, or have thoughts about taking your own life, get help right away. You can go to your nearest emergency department or call: Your local emergency services (911 in the [place] A suicide crisis helpline, such as the National Suicide Prevention Lifeline at 442-855-1257. This is open 24 hours a day. Summary Taking steps to learn and use tension reduction techniques can help calm you and help prevent triggering an anxiety reaction. When used together, medicines, psychotherapy, and tension reduction techniques  may be the most effective treatment. Family, friends, and partners can play a big part in helping you recover from an anxiety disorder. This information is not intended to replace advice given to you by your health care provider. Make sure you discuss any questions you have with your healthcare  provider. Document Revised: 07/25/2018 Document Reviewed: 07/25/2018 Elsevier Patient Education  2022 [employer].

## 2020-10-05 ENCOUNTER — Encounter: Payer: Self-pay | Admitting: Family Medicine

## 2020-10-05 DIAGNOSIS — Q676 Pectus excavatum: Secondary | ICD-10-CM

## 2020-10-06 ENCOUNTER — Other Ambulatory Visit: Payer: Self-pay

## 2020-10-09 ENCOUNTER — Ambulatory Visit (INDEPENDENT_AMBULATORY_CARE_PROVIDER_SITE_OTHER): Payer: 59 | Admitting: Allergy

## 2020-10-09 ENCOUNTER — Encounter: Payer: Self-pay | Admitting: Allergy

## 2020-10-09 ENCOUNTER — Other Ambulatory Visit: Payer: Self-pay

## 2020-10-09 VITALS — BP 130/98 | HR 62 | Temp 98.0°F | Resp 18 | Ht 72.0 in | Wt 178.0 lb

## 2020-10-09 DIAGNOSIS — L2481 Irritant contact dermatitis due to metals: Secondary | ICD-10-CM

## 2020-10-09 NOTE — Patient Instructions (Signed)
He will be having a pectus excavatum surgery and his surgeon is requesting allergEAZE metal allergy testing prior. Discussed today metal allergy testing involves patch testing to variety of different metals that is placed and taped on the back.  Patches are best placed on Monday with return to me in office on Wednesday and Friday at the same week. Advised not to get the patches wet once they are placed. He can take antihistamines if needed for itch control.  He will schedule patch testing placement

## 2020-10-09 NOTE — Progress Notes (Signed)
New Patient Note  RE: Philip Spencer MRN: [id number] DOB: [birth year] Date of Office Visit: 10/09/2020  Referring provider: Shade Flood, MD Primary care provider: Shade Flood, MD  Chief Complaint: metal testing  History of present illness: Philip Spencer is a 30 y.o. male presenting today for consultation for metal allergy testing.  He has pectus excavatum and is planning to have surgical procedure done to help repair this.  His surgeon is recommending he perform metal allergy testing prior to surgery.  He states a metal bar will be implanted and kept in place for about 3 years for the repair.    He is not aware of any history of metal allergy.  Denies any contact dermatitis with exposure to items like belt buckles or watches.   He does report some body products can cause a rash.  He dose have acne that is on his back and he does use topical creams for this.   Review of systems: Review of Systems  Constitutional: Negative.   HENT: Negative.    Eyes: Negative.   Respiratory: Negative.    Cardiovascular: Negative.   Gastrointestinal: Negative.   Musculoskeletal: Negative.   Skin: Negative.   Neurological: Negative.    All other systems negative unless noted above in HPI  Past medical history: Past Medical History:  Diagnosis Date   Substance abuse (HCC)    clean/sober since 08/24/2012    Past surgical history: History reviewed. No pertinent surgical history.  Family history:  History reviewed. No pertinent family history.  Social history: Lives in a condo with carpeting in the bedroom with gas heating and central cooling.  1 dog in the bowl.  There is no concern for water damage, mildew or roaches in the home.  Currently does not report an occupation. Tobacco Use   Smoking status: Former    Packs/day: 0.50    Types: Cigarettes    Quit date: 04/18/2019    Years since quitting: 1.4   Smokeless tobacco: Never  Substance and Sexual Activity   Alcohol use: Yes     Comment: occasionally   Drug use: No    Types: Benzodiazepines, Marijuana, Other-see comments    Comment: Clean since 08/24/2012 (psychedelics, opiods)     Medication List: Current Outpatient Medications  Medication Sig Dispense Refill   clindamycin-benzoyl peroxide (BENZACLIN) gel Apply 1 application topically every other day.     sildenafil (VIAGRA) 50 MG tablet Take 0.5-1 tablets (25-50 mg total) by mouth daily as needed for erectile dysfunction. 10 tablet 2   sertraline (ZOLOFT) 25 MG tablet Take 1 tablet (25 mg total) by mouth daily. 90 tablet 1   tretinoin (RETIN-A) 0.05 % cream Apply topically as directed. (Patient not taking: Reported on 10/09/2020)     No current facility-administered medications for this visit.    Known medication allergies: No Known Allergies   Physical examination: Blood pressure (!) 130/98, pulse 62, temperature 98 F (36.7 C), temperature source Temporal, resp. rate 18, height 6' (1.829 m), weight 178 lb (80.7 kg), SpO2 100 %.  General: Alert, interactive, in no acute distress. HEENT: PERRLA, TMs pearly gray, turbinates non-edematous without discharge, post-pharynx non erythematous. Neck: Supple without lymphadenopathy. Lungs: Clear to auscultation without wheezing, rhonchi or rales. {no increased work of breathing. CV: Normal S1, S2 without murmurs. Abdomen: Nondistended, nontender. Skin: Warm and dry, without lesions or rashes. Extremities:  No clubbing, cyanosis or edema. Neuro:   Grossly intact.  Diagnositics/Labs: None today  Assessment and  plan: Contact dermatitis, evaluation  He will be having a pectus excavatum surgery and his surgeon is requesting allergEAZE metal allergy testing prior. Discussed today metal allergy testing involves patch testing to variety of different metals that is placed and taped on the back.  Patches are best placed on Monday with return to me in office on Wednesday and Friday at the same week. Advised not to get  the patches wet once they are placed. He can take antihistamines if needed for itch control.  He will schedule patch testing placement  I appreciate the opportunity to take part in Philip Spencer's care. Please do not hesitate to contact me with questions.  Sincerely,   Margo Aye, MD Allergy/Immunology [facility]

## 2020-10-18 ENCOUNTER — Other Ambulatory Visit: Payer: Self-pay | Admitting: Family Medicine

## 2020-10-18 DIAGNOSIS — Q676 Pectus excavatum: Secondary | ICD-10-CM

## 2020-10-18 NOTE — Progress Notes (Signed)
Order for PFT's placed.

## 2020-10-23 ENCOUNTER — Ambulatory Visit: Payer: 59

## 2020-10-27 ENCOUNTER — Ambulatory Visit (INDEPENDENT_AMBULATORY_CARE_PROVIDER_SITE_OTHER): Payer: 59 | Admitting: Family

## 2020-10-27 ENCOUNTER — Encounter: Payer: Self-pay | Admitting: Family

## 2020-10-27 ENCOUNTER — Encounter: Payer: Self-pay | Admitting: Family Medicine

## 2020-10-27 ENCOUNTER — Other Ambulatory Visit: Payer: Self-pay

## 2020-10-27 VITALS — BP 120/76 | HR 69 | Temp 98.0°F | Resp 18 | Ht 72.0 in | Wt 179.0 lb

## 2020-10-27 DIAGNOSIS — L2481 Irritant contact dermatitis due to metals: Secondary | ICD-10-CM | POA: Diagnosis not present

## 2020-10-27 NOTE — Progress Notes (Signed)
Follow-up Note  RE: Philip Spencer MRN: [id number] DOB: August 10, 1990 Date of Office Visit: 10/27/2020  Primary care provider: Shade Flood, MD Referring provider: Shade Flood, MD   Korben returns to the office today for the patch test placement, given suspected history of contact dermatitis.    Diagnostics:  metal patches placed.    Plan:   Allergic contact dermatitis - Instructions provided on care of the patches for the next 48 hours. - Kamdin was instructed to avoid showering for the next 48 hours. - [place] will follow up in 48 hours and 96 hours for patch readings.  Nehemiah Settle, FNP [facility]

## 2020-10-28 NOTE — Progress Notes (Deleted)
   FOLLOW UP Date of Service/Encounter:  10/28/20   Subjective:  No chief complaint on file.   Philip Spencer (DOB: 11-07-1990) is a 30 y.o. male who [facility]2020 in re-evaluation of the following: first patch test read History obtained from: chart review and {Persons; PED relatives w/patient:19415::"patient"}.  For Review, LV was on 10/27/20  with Nehemiah Settle, FNP.seen for patch test placement for metals for upcoming surgery (repair of pectus excavatum).    Allergies as of 10/29/2020   No Known Allergies      Medication List        Accurate as of October 28, 2020  2:40 PM. If you have any questions, ask your nurse or doctor.          clindamycin-benzoyl peroxide gel Commonly known as: BENZACLIN Apply 1 application topically every other day.   sertraline 25 MG tablet Commonly known as: Zoloft Take 1 tablet (25 mg total) by mouth daily.   sildenafil 50 MG tablet Commonly known as: Viagra Take 0.5-1 tablets (25-50 mg total) by mouth daily as needed for erectile dysfunction.   tretinoin 0.05 % cream Commonly known as: RETIN-A Apply topically as directed.       Past Medical History:  Diagnosis Date   Substance abuse (HCC)    clean/sober since 08/24/2012   No past surgical history on file. Otherwise, there have been no changes to his past medical history, surgical history, family history, or social history.  ROS: All others negative except as noted per HPI.   Objective:  There were no vitals taken for this visit. There is no height or weight on file to calculate BMI. Physical Exam: General Appearance:  Alert, cooperative, no distress, appears stated age  Head:  Normocephalic, without obvious abnormality, atraumatic  Eyes:  Conjunctiva clear, EOM's intact  Nose: Nares normal  Throat: Lips, tongue normal; teeth and gums normal  Neck: Supple, symmetrical  Lungs:   Respirations unlabored, no coughing  Heart:  Appears  well perfused  Extremities: No edema  Skin: Skin color, texture, turgor normal, no rashes or lesions on visualized portions of skin  Neurologic: No gross deficits   Reviewed: ***  Spirometry:  Tracings reviewed. His effort: {Blank single:19197::"Good reproducible efforts.","It was hard to get consistent efforts and there is a question as to whether this reflects a maximal maneuver.","Poor effort, data can not be interpreted."} FVC: ***L FEV1: ***L, ***% predicted FEV1/FVC ratio: ***% Interpretation: {Blank single:19197::"Spirometry consistent with mild obstructive disease","Spirometry consistent with moderate obstructive disease","Spirometry consistent with severe obstructive disease","Spirometry consistent with possible restrictive disease","Spirometry consistent with mixed obstructive and restrictive disease","Spirometry uninterpretable due to technique","Spirometry consistent with normal pattern","No overt abnormalities noted given today's efforts"}.  Please see scanned spirometry results for details.  Skin Testing: {Blank single:19197::"Select foods","Environmental allergy panel","Environmental allergy panel and select foods","Food allergy panel","None","Deferred due to recent antihistamines use"}. Positive test to: ***. Negative test to: ***.  Results discussed with patient/family.   {Blank single:19197::"Allergy testing results were read and interpreted by myself, documented by clinical staff."," "}  Assessment:  No diagnosis found.  Plan/Recommendations:  There are no Patient Instructions on file for this visit.  No follow-ups on file.  Tonny Bollman, MD  [facility]

## 2020-10-29 ENCOUNTER — Other Ambulatory Visit: Payer: Self-pay

## 2020-10-29 ENCOUNTER — Encounter: Payer: Self-pay | Admitting: Internal Medicine

## 2020-10-29 ENCOUNTER — Ambulatory Visit: Payer: 59 | Admitting: Internal Medicine

## 2020-10-29 DIAGNOSIS — L2481 Irritant contact dermatitis due to metals: Secondary | ICD-10-CM

## 2020-10-29 NOTE — Progress Notes (Signed)
   Follow Up Note  RE: Philip Spencer MRN: [id number] DOB: Nov 03, 1990 Date of Office Visit: 10/29/2020  Referring provider: Shade Flood, MD Primary care provider: Shade Flood, MD  History of Present Illness: I had the pleasure of seeing Philip Spencer for a follow up visit at the [facility] on 10/29/2020. He is a 30 y.o. male, who is being followed for potential metal contact dermatitis. Today he is here for initial patch test interpretation.  He was initially seen on 10/09/20 for rule out metal contact allergy as he is having surgery soon for pectus excavatum repair.  His initial patch was placed on 08/22, and he presents for 48 hour read.  No previous history of metal reaction.  Diagnostics:  TRUE TEST 48 hour reading: Negative    Assessment and Plan: Philip Spencer is a 30 y.o. male with: Pectus Excavatum presenting for evaluation of metal contact dermatitis prior to surgery. 48 hr read negative  - please return on Friday for your final patch test read - continue to keep your back dry-no showers, no exercise or other activities that can cause excessive sweating - okay to use antihistamines for itching if needed, do not use topical or oral steroids  It was my pleasure to see Philip Spencer today and participate in his care. Please feel free to contact me with any questions or concerns.  Sincerely,  Tonny Bollman, MD Allergy & Immunology [facility]

## 2020-10-29 NOTE — Patient Instructions (Addendum)
Evaluation for Contact Dermatitis with Metals prior to Surgery:  - please return on Friday for your final patch test read - continue to keep your back dry-no showers, no exercise or other activities that can cause excessive sweating - okay to use antihistamines for itching if needed, do not use topical or oral steroids

## 2020-10-31 ENCOUNTER — Other Ambulatory Visit: Payer: Self-pay

## 2020-10-31 ENCOUNTER — Ambulatory Visit: Payer: 59 | Admitting: Allergy

## 2020-10-31 ENCOUNTER — Ambulatory Visit: Payer: 59 | Admitting: Cardiology

## 2020-10-31 ENCOUNTER — Encounter: Payer: Self-pay | Admitting: Family Medicine

## 2020-10-31 ENCOUNTER — Encounter: Payer: Self-pay | Admitting: Allergy

## 2020-10-31 ENCOUNTER — Encounter: Payer: Self-pay | Admitting: Cardiology

## 2020-10-31 VITALS — BP 140/90 | HR 84 | Temp 97.9°F | Resp 17 | Ht 72.0 in | Wt 178.2 lb

## 2020-10-31 VITALS — Temp 97.6°F

## 2020-10-31 DIAGNOSIS — Z01818 Encounter for other preprocedural examination: Secondary | ICD-10-CM

## 2020-10-31 DIAGNOSIS — R0989 Other specified symptoms and signs involving the circulatory and respiratory systems: Secondary | ICD-10-CM

## 2020-10-31 DIAGNOSIS — L259 Unspecified contact dermatitis, unspecified cause: Secondary | ICD-10-CM

## 2020-10-31 DIAGNOSIS — Q676 Pectus excavatum: Secondary | ICD-10-CM

## 2020-10-31 NOTE — Telephone Encounter (Signed)
From pt

## 2020-10-31 NOTE — Progress Notes (Signed)
Primary Physician/Referring:  Shade Flood, MD  Patient ID: Philip Spencer, male    DOB: [birth year], 30 y.o.   MRN: [id number]  Chief Complaint  Patient presents with   New Patient (Initial Visit)   Pre-op Exam   HPI:    Philip Spencer  is a 30 y.o. Caucasian male patient with no other significant cardiovascular history except moderate pectus excavatum, planning to have surgery soon and wanted preop evaluation.  In spite of pectus excavatum being told to be mild, he wants to pursue surgery. There is no family history of any vascular disease or aneurysm.  He is physically active and does play basketball and also goes for a run without any symptoms.  He remains asymptomatic.  Past Medical History:  Diagnosis Date   Substance abuse (HCC)    clean/sober since 08/24/2012   History reviewed. No pertinent surgical history. Family History  Problem Relation Age of Onset   Hypertension Father    Breast cancer Maternal Aunt    Leukemia Maternal Aunt     Social History   Tobacco Use   Smoking status: Former    Packs/day: 0.50    Years: 10.00    Pack years: 5.00    Types: Cigarettes    Quit date: 04/13/2019    Years since quitting: 1.5   Smokeless tobacco: Never  Substance Use Topics   Alcohol use: Yes    Comment: occasionally   Marital Status: Single  ROS  Review of Systems  Cardiovascular:  Negative for chest pain, dyspnea on exertion and leg swelling.  Gastrointestinal:  Negative for melena.  Objective  Blood pressure 140/90, pulse 84, temperature 97.9 F (36.6 C), temperature source Temporal, resp. rate 17, height 6' (1.829 m), weight 178 lb 3.2 oz (80.8 kg), SpO2 97 %. Body mass index is 24.17 kg/m.  Vitals with BMI 10/31/2020 10/31/2020 10/31/2020  Height - - 6\' 0"   Weight - - 178 lbs 3 oz  BMI - - 24.16  Systolic 140 137  Diastolic 90 101 96  Pulse - 84 103     Physical Exam Neck:     Vascular: No carotid bruit or JVD.  Cardiovascular:     Rate and Rhythm:  Normal rate and regular rhythm.     Pulses: Intact distal pulses.     Heart sounds: Normal heart sounds. No murmur heard.   No gallop.  Pulmonary:     Effort: Pulmonary effort is normal.     Breath sounds: Normal breath sounds.  Chest:     Comments: Pectus excavatum Abdominal:     General: Bowel sounds are normal.     Palpations: Abdomen is soft.  Musculoskeletal:        General: No swelling.     Laboratory examination:   No results for input(s): NA, K, CL, CO2, GLUCOSE, BUN, CREATININE, CALCIUM, GFRNONAA, GFRAA in the last 8760 hours. CrCl cannot be calculated (Patient's most recent lab result is older than the maximum 21 days allowed.).  CMP Latest Ref Rng & Units 11/05/2012  Glucose 70 - 99 mg/dL 11/07/2012)  BUN 6 - 23 mg/dL 17  Creatinine 381(O - 1.75 mg/dL 1.02  Sodium 5.85 - 277 mEq/L 138  Potassium 3.5 - 5.1 mEq/L 4.0  Chloride 96 - 112 mEq/L 100  CO2 19 - 32 mEq/L 29  Calcium 8.4 - 10.5 mg/dL 9.7  Total Protein 6.0 - 8.3 g/dL 6.7  Total Bilirubin 0.3 - 1.2 mg/dL 1.0  Alkaline Phos 39 -  117 U/L 80  AST 0 - 37 U/L 23  ALT 0 - 53 U/L 10   CBC Latest Ref Rng & Units 09/17/2019 11/05/2012  WBC 3.4 - 10.8 x10E3/uL 5.3 4.8  Hemoglobin 13.0 - 17.7 g/dL 96.0 45.4  Hematocrit 09.8 - 51.0 % 47.9 41.8  Platelets 150 - 450 x10E3/uL 152 133(L)    Lipid Panel No results for input(s): CHOL, TRIG, LDLCALC, VLDL, HDL, CHOLHDL, LDLDIRECT in the last 8760 hours. Lipid Panel  No results found for: CHOL, TRIG, HDL, CHOLHDL, VLDL, LDLCALC, LDLDIRECT, LABVLDL   HEMOGLOBIN A1C No results found for: HGBA1C, MPG TSH No results for input(s): TSH in the last 8760 hours.  Medications and allergies  No Known Allergies   Medication prior to this encounter:   Outpatient Medications Prior to Visit  Medication Sig Dispense Refill   clindamycin-benzoyl peroxide (BENZACLIN) gel Apply 1 application topically every other day.     tretinoin (RETIN-A) 0.05 % cream Apply topically as directed.      sertraline (ZOLOFT) 25 MG tablet Take 1 tablet (25 mg total) by mouth daily. 90 tablet 1   sildenafil (VIAGRA) 50 MG tablet Take 0.5-1 tablets (25-50 mg total) by mouth daily as needed for erectile dysfunction. 10 tablet 2   No facility-administered medications prior to visit.     Medication list after today's encounter   Current Outpatient Medications  Medication Instructions   clindamycin-benzoyl peroxide (BENZACLIN) gel 1 application, Topical, Every other day   tretinoin (RETIN-A) 0.05 % cream Topical, As directed    Radiology:   CXR PA and LAT 09/17/2019: The heart size and mediastinal contours are within normal limits. Both lungs are clear. No pleural effusion or pneumothorax. Pectus excavatum.  Cardiac Studies:   NA  EKG:   EKG 10/31/2020: Normal sinus rhythm at rate of 92 bpm, normal axis.  No evidence of ischemia, normal EKG.    Assessment     ICD-10-CM   1. Pre-op evaluation  Z01.818 EKG 12-Lead    2. Pectus excavatum  Q67.6     3. Labile hypertension  R09.89       Medications Discontinued During This Encounter  Medication Reason   sertraline (ZOLOFT) 25 MG tablet Error   sildenafil (VIAGRA) 50 MG tablet Error    No orders of the defined types were placed in this encounter.  Orders Placed This Encounter  Procedures   EKG 12-Lead   Recommendations:   Philip Spencer is a 30 y.o. Caucasian male patient with no other significant cardiovascular history except moderate pectus excavatum, planning to have surgery soon and wanted preop evaluation.  In spite of pectus excavatum which I feel clinically is mild, he wants to pursue surgery.  There is no abnormal auscultation, no mitral valve click or bicuspid valve click or murmur.  There is no family history of any vascular disease or aneurysm.  His EKG is unremarkable.  His blood pressure was markedly elevated when he presented, did improve but never to normal.  States that he has noticed elevated blood pressure when  he drinks coffee, advised him that he may have labile hypertension and that he should make lifestyle changes as he eats out pretty much every day.  He will continue to monitor his blood pressure and be in touch with his PCP, would consider addition of an ARB if blood pressure remains elevated.  Otherwise from cardiac standpoint he is low risk, I will send a clearance letter to his surgeon.   CC; Macy Mis,  MD  (Jenel Lucks, [place]) Fax:    Yates Decamp, MD, [facility] 10/31/2020, 12:26 PM Office: [phone]

## 2020-10-31 NOTE — Progress Notes (Signed)
    Follow-up Note  RE: Philip Spencer MRN: [id number] DOB: Aug 03, 1990 Date of Office Visit: 10/31/2020  Primary care provider: Shade Flood, MD Referring provider: Shade Flood, MD   [name] returns to the office today for the final patch test interpretation, given suspected history of contact dermatitis I assist with medical chart for is not 1 taking the steroids they have 1 just 1 shot now today  Metal series 96 hour reading: negative Metals tested for include chromium chloride 1%, potassium dicrhomate 0.25%, cobalt chloride hexahydrate 1%, copper sulfate pentahydrate 2%, molybdenum cloride 0.5%, titanium 0.1%, tantal, magnese chloride 0.5%, nickel sulfate hyexahydrate 5%, aluminum hydroxide 10% and vanadium pentoxide 10%  Plan:  Metal allergy testing is negative today.  He did not exhibit any contact dermatitis symptoms related to metal patch testing.   Follow-up as needed  Margo Aye, MD [facility]

## 2020-10-31 NOTE — Patient Instructions (Addendum)
Metal series patch testing is negative.   Metals tested for include chromium chloride 1%, potassium dicrhomate 0.25%, cobalt chloride hexahydrate 1%, copper sulfate pentahydrate 2%, molybdenum cloride 0.5%, titanium 0.1%, tantal, magnese chloride 0.5%, nickel sulfate hyexahydrate 5%, aluminum hydroxide 10% and vanadium pentoxide 10%  Follow-up as needed

## 2020-11-03 DIAGNOSIS — Z01818 Encounter for other preprocedural examination: Secondary | ICD-10-CM

## 2020-11-03 DIAGNOSIS — Q676 Pectus excavatum: Secondary | ICD-10-CM

## 2020-11-10 NOTE — Progress Notes (Signed)
Synopsis: Referred for preoperative pulmonary evaluation by Philip Flood, MD  Subjective:   PATIENT ID: Philip Spencer GENDER: male DOB: [birth year], MRN: [id number]  Chief Complaint  Patient presents with   Consult    Pt states doing work up for surgery, and full pft with post pre-spirometry    30yM with no significant PMH save for pectus excavatum and planned surgery is referred for preoperative evaluation.  He says he has random trouble breathing and dull pain where pectus is - sometimes at rest or with exertion. Hard for him to predict what triggers it. No cough. No alleviating factors save for sitting down and relaxing.   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 He was on an inhaler - he thinks it was some kind of diskus inhaler - he thought inhaler was helpful back then.  Can climb 7-8 flights of stairs without having to stop for dyspnea.  Otherwise pertinent review of systems is negative.  Mom's side had 3rd or 4th cousin with pectus excavatum that was more severe.   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   Did smoke half ppd for 10-11 years, quit 2021.   Past Medical History:  Diagnosis Date   Substance abuse (HCC)    clean/sober since 08/24/2012     Family History  Problem Relation Age of Onset   Hypertension Father    Breast cancer Maternal Aunt    Leukemia Maternal Aunt      No past surgical history on file.  Social History   Socioeconomic History   Marital status: Single    Spouse name: Not on file   Number of children: 0   Years of education: Not on file   Highest education level: Not on file  Occupational History   Occupation: sales +    Comment: [employer] store  Tobacco Use   Smoking status: Former    Packs/day: 0.50    Years: 10.00    Pack years: 5.00    Types: Cigarettes    Quit date: 04/13/2019    Years since quitting: 1.5   Smokeless tobacco: Never  Vaping Use   Vaping Use: Former    Substances: Nicotine, Flavoring  Substance and Sexual Activity   Alcohol use: Yes    Comment: occasionally   Drug use: Yes    Types: Benzodiazepines, Marijuana, Other-see comments    Comment: Clean since 08/24/2012 (psychedelics, opiods)   Sexual activity: Not Currently  Other Topics Concern   Not on file  Social History Narrative   Lives with his parents.   Social Determinants of Health   Financial Resource Strain: Not on file  Food Insecurity: Not on file  Transportation Needs: Not on file  Physical Activity: Not on file  Stress: Not on file  Social Connections: Not on file  Intimate Partner Violence: Not on file     No Known Allergies   Outpatient Medications Prior to Visit  Medication Sig Dispense Refill   clindamycin-benzoyl peroxide (BENZACLIN) gel Apply 1 application topically every other day.     tretinoin (RETIN-A) 0.05 % cream Apply topically as directed.     No facility-administered medications prior to visit.       Objective:   Physical Exam:  General appearance: 30 y.o., male, NAD, conversant  Eyes: anicteric sclerae, moist conjunctivae; no lid-lag; PERRL, tracking appropriately HENT: NCAT; oropharynx, MMM, no mucosal ulcerations; normal hard and soft palate Neck: Trachea midline; no lymphadenopathy, no  JVD Lungs: CTAB, no crackles, no wheeze, with normal respiratory effort. Mild pectus deformity CV: RRR, no MRGs  Abdomen: Soft, non-tender; non-distended, BS present  Extremities: No peripheral edema, radial and DP pulses present bilaterally  Skin: Normal temperature, turgor and texture; no rash Psych: Appropriate affect Neuro: Alert and oriented to person and place, no focal deficit    Vitals:   11/11/20 1056  BP: 128/82  Pulse: 80  Temp: 97.9 F (36.6 C)  TempSrc: Oral  SpO2: 98%  Weight: 177 lb 9.6 oz (80.6 kg)  Height: 6' (1.829 m)   98% on RA BMI Readings from Last 3 Encounters:  11/11/20 24.09 kg/m  10/31/20 24.17 kg/m  10/27/20  24.28 kg/m   Wt Readings from Last 3 Encounters:  11/11/20 177 lb 9.6 oz (80.6 kg)  10/31/20 178 lb 3.2 oz (80.8 kg)  10/27/20 179 lb (81.2 kg)     CBC    Component Value Date/Time   WBC 5.3 09/17/2019 1624   WBC 4.8 11/05/2012 2000   RBC 5.30 09/17/2019 1624   RBC 4.70 11/05/2012 2000   HGB 16.6 09/17/2019 1624   HCT 47.9 09/17/2019 1624   PLT 152 09/17/2019 1624   MCV 90 09/17/2019 1624   MCH 31.3 09/17/2019 1624   MCH 31.9 11/05/2012 2000   MCHC 34.7 09/17/2019 1624   MCHC 35.9 11/05/2012 2000   RDW 11.7 09/17/2019 1624   LYMPHSABS 1.3 11/05/2012 2000   MONOABS 0.5 11/05/2012 2000   EOSABS 0.1 11/05/2012 2000   BASOSABS 0.0 11/05/2012 2000      Chest Imaging: CXR 09/17/19 reviewed by me and remarkable for pectus excavatum  Pulmonary Functions Testing Results: No flowsheet data found.      Assessment & Plan:   # Pectus excavatum with upcoming surgery:  # History of smoking  # History of asthma  Plan: - Low risk from pulmonary perspective by history. Given his good reported exercise tolerance, I don't think pre-op exercise testing is indicated barring unexpectedly severe ventilatory defect.  - PFTs ordered today - For Philip Spencer, risk of perioperative pulmonary complications is increased by none of the following typical risk factors:  [ ] Age greater than 65 years  [ ]  COPD  [ ]  Serum albumin <3.5  [ ]  Smoking  [ ]  Obstructive sleep apnea  [ ]  NYHA Class II Pulmonary Hypertension  Respiratory complications generally occur in 1% of ASA Class I patients, 5% of ASA Class II and 10% of ASA Class III-IV patients These complications rarely result in mortality and include postoperative pneumonia, atelectasis, pulmonary embolism, ARDS and increased time requiring postoperative mechanical ventilation.  Overall, I recommend proceeding with the surgery if the risk for respiratory complications are outweighed by the potential benefits. This will need to be discussed  between the patient and surgeon.  To reduce risks of respiratory complications, I recommend: --Continued smoking cessation --Pre- and post-operative incentive spirometry performed frequently while awake --Avoiding use of pancuronium during anesthesia. --Encourage mobility and incentive spirometry post-op  For any questions or concerns, please contact our office.     , MD [facility] 11/11/2020 11:16 AM

## 2020-11-11 ENCOUNTER — Encounter: Payer: Self-pay | Admitting: Student

## 2020-11-11 ENCOUNTER — Ambulatory Visit (INDEPENDENT_AMBULATORY_CARE_PROVIDER_SITE_OTHER): Payer: 59 | Admitting: Student

## 2020-11-11 ENCOUNTER — Other Ambulatory Visit: Payer: Self-pay

## 2020-11-11 VITALS — BP 128/82 | HR 80 | Temp 97.9°F | Ht 72.0 in | Wt 177.6 lb

## 2020-11-11 DIAGNOSIS — Z8709 Personal history of other diseases of the respiratory system: Secondary | ICD-10-CM

## 2020-11-11 DIAGNOSIS — Q676 Pectus excavatum: Secondary | ICD-10-CM

## 2020-11-11 DIAGNOSIS — Z87891 Personal history of nicotine dependence: Secondary | ICD-10-CM

## 2020-11-11 NOTE — Patient Instructions (Addendum)
-   We will set up breathing tests for you. I'll let you know if there's anything unexpected that would warrant exercise testing from our standpoint.

## 2020-11-26 ENCOUNTER — Other Ambulatory Visit: Payer: Self-pay

## 2020-11-26 DIAGNOSIS — R0989 Other specified symptoms and signs involving the circulatory and respiratory systems: Secondary | ICD-10-CM

## 2020-12-01 ENCOUNTER — Other Ambulatory Visit: Payer: Self-pay

## 2020-12-01 ENCOUNTER — Ambulatory Visit (INDEPENDENT_AMBULATORY_CARE_PROVIDER_SITE_OTHER): Payer: 59 | Admitting: Student

## 2020-12-01 DIAGNOSIS — Q676 Pectus excavatum: Secondary | ICD-10-CM | POA: Diagnosis not present

## 2020-12-01 LAB — PULMONARY FUNCTION TEST
DL/VA % pred: 108 %
DL/VA: 5.34 ml/min/mmHg/L
DLCO cor % pred: 89 %
DLCO cor: 31.25 ml/min/mmHg
DLCO unc % pred: 89 %
DLCO unc: 31.25 ml/min/mmHg
FEF 25-75 Post: 4.18 L/sec
FEF 25-75 Pre: 3.27 L/sec
FEF2575-%Change-Post: 27 %
FEF2575-%Pred-Post: 88 %
FEF2575-%Pred-Pre: 69 %
FEV1-%Change-Post: 8 %
FEV1-%Pred-Post: 79 %
FEV1-%Pred-Pre: 73 %
FEV1-Post: 3.76 L
FEV1-Pre: 3.47 L
FEV1FVC-%Change-Post: 5 %
FEV1FVC-%Pred-Pre: 95 %
FEV6-%Change-Post: 2 %
FEV6-%Pred-Post: 78 %
FEV6-%Pred-Pre: 76 %
FEV6-Post: 4.51 L
FEV6-Pre: 4.41 L
FEV6FVC-%Pred-Post: 101 %
FEV6FVC-%Pred-Pre: 101 %
FVC-%Change-Post: 2 %
FVC-%Pred-Post: 77 %
FVC-%Pred-Pre: 75 %
FVC-Post: 4.53 L
FVC-Pre: 4.41 L
Post FEV1/FVC ratio: 83 %
Post FEV6/FVC ratio: 100 %
Pre FEV1/FVC ratio: 79 %
Pre FEV6/FVC Ratio: 100 %
RV % pred: 42 %
RV: 0.73 L
TLC % pred: 71 %
TLC: 5.21 L

## 2020-12-01 NOTE — Progress Notes (Signed)
Full PFT performed today. °

## 2020-12-01 NOTE — Patient Instructions (Signed)
Full PFT performed today. °

## 2020-12-12 ENCOUNTER — Encounter: Payer: Self-pay | Admitting: Family Medicine

## 2020-12-17 ENCOUNTER — Ambulatory Visit: Payer: 59 | Admitting: Interventional Cardiology

## 2020-12-17 ENCOUNTER — Ambulatory Visit: Payer: 59 | Admitting: Allergy

## 2020-12-23 ENCOUNTER — Encounter: Payer: Self-pay | Admitting: Family Medicine

## 2020-12-23 DIAGNOSIS — Q676 Pectus excavatum: Secondary | ICD-10-CM

## 2020-12-26 ENCOUNTER — Encounter: Payer: Self-pay | Admitting: Family Medicine

## 2020-12-30 ENCOUNTER — Other Ambulatory Visit: Payer: Self-pay | Admitting: Student

## 2020-12-30 DIAGNOSIS — R942 Abnormal results of pulmonary function studies: Secondary | ICD-10-CM

## 2020-12-30 NOTE — Progress Notes (Unsigned)
Per Dr. Neva Seat, surgery requests that Mr. Philip Spencer undergo CPET as part of preoperative risk stratification which I have ordered.  [facility]

## 2020-12-31 ENCOUNTER — Ambulatory Visit: Payer: 59 | Admitting: Cardiology

## 2021-01-03 NOTE — Telephone Encounter (Signed)
Possible start of Wellbutrin discussed in June as well as tapering off current medication at that time.  Please schedule a virtual visit this week so we can discuss plan and start of Wellbutrin further.

## 2021-01-05 NOTE — Telephone Encounter (Signed)
I have LM asking pt to call the office to schedule an appt ELEA  °

## 2021-01-08 ENCOUNTER — Ambulatory Visit (HOSPITAL_COMMUNITY): Payer: 59 | Attending: Family Medicine

## 2021-01-08 ENCOUNTER — Other Ambulatory Visit: Payer: Self-pay

## 2021-01-08 DIAGNOSIS — R942 Abnormal results of pulmonary function studies: Secondary | ICD-10-CM

## 2021-01-08 DIAGNOSIS — Q676 Pectus excavatum: Secondary | ICD-10-CM | POA: Insufficient documentation

## 2021-01-21 ENCOUNTER — Telehealth: Payer: Self-pay

## 2021-01-21 NOTE — Telephone Encounter (Signed)
Caller name:[facility]   On DPR? :No  Call back number:[phone]  Provider they see: Neva Seat  Reason for call:Pro Authorization for MRI was sent to wrong depart [facility] is the correct department for the pro authorization  [facility] [phone]

## 2021-01-21 NOTE — Telephone Encounter (Signed)
Can you provide me with information or other follow up for this as note states this was send to incorrect location can you advise?

## 2021-01-26 ENCOUNTER — Telehealth: Payer: Self-pay
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
## 2021-01-27 NOTE — Telephone Encounter (Signed)
Echo ordered by cardiology - if he has not done it and same insurance, not sure a new order is needed. Billing may just need new number if that has changed. Same for MRI - original order should still be ok if same insurance. Let me know what the imaging facility says. Want to make sure it is correct MRI order as well - see prior message. Need to verify if surgeon wants MRI chest with or without gadolinium.

## 2021-01-27 NOTE — Telephone Encounter (Signed)
Orders look correct  If insurance is all the same then everything remains the same. The insurance automatically generates into the orders if it is entered into the demographics of the patients chart, so if there is new insurance information that needs to be input into the patient demos.

## 2021-01-27 NOTE — Telephone Encounter (Signed)
Can you help me look into these orders? Pt has same insurance, are new orders needed?

## 2021-01-28 NOTE — Telephone Encounter (Signed)
Pt has been scheduled and is going to call Cardiology to schedule Echo as well.  No change to insurance numbers. no other actions at this time.

## 2021-02-03 ENCOUNTER — Ambulatory Visit
Admission: RE | Admit: 2021-02-03 | Discharge: 2021-02-03 | Disposition: A | Discharge status: Home / Self Care | Payer: 59 | Source: Ambulatory Visit | Attending: Family Medicine | Admitting: Family Medicine

## 2021-02-03 ENCOUNTER — Telehealth: Payer: Self-pay

## 2021-02-03 DIAGNOSIS — Q676 Pectus excavatum: Secondary | ICD-10-CM

## 2021-02-03 NOTE — Telephone Encounter (Signed)
Caller name:[facility] Jodelle Green)  On DPR? :Yes  Call back [phone]  Provider they see: Neva Seat   Reason for call: Imaging is calling pt was schedule 11:30 today for MRI and they are not able to get the pictures that Dr Neva Seat is wanting he will need a CT Scan instead MRI per Radiology

## 2021-02-03 NOTE — Telephone Encounter (Signed)
Disregard previous message, it appears he did have the MRI so should not need further imaging at this point.

## 2021-02-03 NOTE — Telephone Encounter (Signed)
Call from imaging requesting change in patients order

## 2021-02-03 NOTE — Telephone Encounter (Signed)
Requested study was MRI per his surgeon, which is what I received prior authorization for.  I do not feel comfortable changing that procedure unless his surgeon for pectus surgery specifically requests CT.  Please have patient call his surgeon's office, or the radiologist can call that surgeons office with specific request if need be.  Additionally if radiologist needs to provide me information that can be relayed to his surgeon, I am happy to do so.  Let me know.

## 2021-02-10 ENCOUNTER — Other Ambulatory Visit: Payer: Self-pay

## 2021-02-10 ENCOUNTER — Ambulatory Visit: Payer: 59

## 2021-02-10 ENCOUNTER — Telehealth: Payer: Self-pay | Admitting: Family Medicine

## 2021-02-10 DIAGNOSIS — R0989 Other specified symptoms and signs involving the circulatory and respiratory systems: Secondary | ICD-10-CM

## 2021-02-10 NOTE — Telephone Encounter (Signed)
..  Caller name:Adron Court Joy  On DPR? :yes/no: Yes  Call back number:[phone]  Provider they see: Neva Seat Reason for call: Patient would like all results of these tests either faxed to him at [phone] or emailed at:  [email or web address]

## 2021-02-11 NOTE — Telephone Encounter (Signed)
Faxed to patient

## 2021-02-13 ENCOUNTER — Encounter: Payer: Self-pay | Admitting: Family Medicine

## 2021-02-16 ENCOUNTER — Encounter: Payer: Self-pay | Admitting: Cardiology

## 2021-02-16 NOTE — Progress Notes (Signed)
Normal echocardiogram, a copy of the report sent to patient's MyChart messaging

## 2021-02-16 NOTE — Telephone Encounter (Signed)
Philip Spencer, [name] is wanting to know if the Ecocardiogram was sent to the surgeon as well with the rest of the test results?

## 2021-02-17 NOTE — Telephone Encounter (Signed)
faxed

## 2021-03-04 ENCOUNTER — Ambulatory Visit: Payer: 59 | Admitting: Family Medicine

## 2021-03-24 DIAGNOSIS — Q676 Pectus excavatum: Secondary | ICD-10-CM | POA: Diagnosis not present

## 2021-03-30 DIAGNOSIS — Q676 Pectus excavatum: Secondary | ICD-10-CM | POA: Diagnosis not present

## 2021-03-30 DIAGNOSIS — R0689 Other abnormalities of breathing: Secondary | ICD-10-CM | POA: Diagnosis not present

## 2021-03-31 ENCOUNTER — Encounter: Payer: Self-pay | Admitting: Family Medicine

## 2021-03-31 DIAGNOSIS — T490X5A Adverse effect of local antifungal, anti-infective and anti-inflammatory drugs, initial encounter: Secondary | ICD-10-CM | POA: Diagnosis not present

## 2021-03-31 DIAGNOSIS — Z803 Family history of malignant neoplasm of breast: Secondary | ICD-10-CM | POA: Diagnosis not present

## 2021-03-31 DIAGNOSIS — R0989 Other specified symptoms and signs involving the circulatory and respiratory systems: Secondary | ICD-10-CM | POA: Diagnosis not present

## 2021-03-31 DIAGNOSIS — R52 Pain, unspecified: Secondary | ICD-10-CM | POA: Diagnosis not present

## 2021-03-31 DIAGNOSIS — Z87891 Personal history of nicotine dependence: Secondary | ICD-10-CM | POA: Diagnosis not present

## 2021-03-31 DIAGNOSIS — G8918 Other acute postprocedural pain: Secondary | ICD-10-CM | POA: Diagnosis not present

## 2021-03-31 DIAGNOSIS — Z79899 Other long term (current) drug therapy: Secondary | ICD-10-CM | POA: Diagnosis not present

## 2021-03-31 DIAGNOSIS — F1290 Cannabis use, unspecified, uncomplicated: Secondary | ICD-10-CM | POA: Diagnosis not present

## 2021-03-31 DIAGNOSIS — Z8616 Personal history of COVID-19: Secondary | ICD-10-CM | POA: Diagnosis not present

## 2021-03-31 DIAGNOSIS — Q676 Pectus excavatum: Secondary | ICD-10-CM | POA: Diagnosis not present

## 2021-03-31 DIAGNOSIS — Z8249 Family history of ischemic heart disease and other diseases of the circulatory system: Secondary | ICD-10-CM | POA: Diagnosis not present

## 2021-03-31 DIAGNOSIS — Z789 Other specified health status: Secondary | ICD-10-CM | POA: Diagnosis not present

## 2021-03-31 DIAGNOSIS — T8182XA Emphysema (subcutaneous) resulting from a procedure, initial encounter: Secondary | ICD-10-CM | POA: Diagnosis not present

## 2021-03-31 DIAGNOSIS — Z719 Counseling, unspecified: Secondary | ICD-10-CM | POA: Diagnosis not present

## 2021-03-31 DIAGNOSIS — Z806 Family history of leukemia: Secondary | ICD-10-CM | POA: Diagnosis not present

## 2021-03-31 DIAGNOSIS — Z20822 Contact with and (suspected) exposure to covid-19: Secondary | ICD-10-CM | POA: Diagnosis not present

## 2021-03-31 DIAGNOSIS — L244 Irritant contact dermatitis due to drugs in contact with skin: Secondary | ICD-10-CM | POA: Diagnosis not present

## 2021-03-31 DIAGNOSIS — K219 Gastro-esophageal reflux disease without esophagitis: Secondary | ICD-10-CM | POA: Diagnosis not present

## 2021-04-01 ENCOUNTER — Encounter: Payer: Self-pay | Admitting: Cardiology

## 2021-04-01 NOTE — Telephone Encounter (Signed)
From patient.

## 2021-04-01 NOTE — Telephone Encounter (Signed)
FYI: pt had Nuss procedure

## 2021-04-15 ENCOUNTER — Ambulatory Visit: Payer: Self-pay | Admitting: Family Medicine

## 2021-04-16 ENCOUNTER — Ambulatory Visit (INDEPENDENT_AMBULATORY_CARE_PROVIDER_SITE_OTHER): Payer: BC Managed Care – PPO | Admitting: Family Medicine

## 2021-04-16 ENCOUNTER — Encounter: Payer: Self-pay | Admitting: Family Medicine

## 2021-04-16 VITALS — BP 122/84 | HR 97 | Temp 98.2°F | Resp 16 | Ht 72.0 in | Wt 174.6 lb

## 2021-04-16 DIAGNOSIS — Q676 Pectus excavatum: Secondary | ICD-10-CM

## 2021-04-16 NOTE — Progress Notes (Signed)
Subjective:  Patient ID: Philip Spencer, male    DOB: 06-18-1990  Age: 31 y.o. MRN: [id number]  CC:  Chief Complaint  Patient presents with   Referral    Pt here for post operative referral to PT, recently had Nuss procedure done, pt reports has noticed some fluid build up at site of incision but unsure who to see for drainage as surgeon is in [place]    HPI Philip Spencer presents for   History of Nuss procedure for pectus excavatum.  Performed by Dr. Jodelle Red on January 24, surgeon in [place]. Center chest scar, 2 others on each side of chest.  Has noticed some fluid buildup around the scar on upper aspect of upper left chest scar - noticed puffiness in upper part of scar about 6 days later, surgeon drained swollen area about 5-6 days after surgery, applied ace wrap - still wearing. Saw 2 days later - doing ok. Traveled back to [place].  Notices puffiness with ace bandage off. Seems same as when drained prior. Video visit with surgeon yesterday - he recommended eval in [place] in few days if swelling persists. He plans to travel to [place] to have this evaluated on Monday.  Anchors for bars in that area. No fever.  Breathing ok.  He also needs referral for physical therapy, prescription noted from care providers in [place].  PT eval and treat 2 times weekly for 8 weeks.  Has paperwork for rehab protocol.  History There are no problems to display for this patient.  Past Medical History:  Diagnosis Date   Substance abuse (HCC)    clean/sober since 08/24/2012   No past surgical history on file. No Known Allergies Prior to Admission medications   Medication Sig Start Date End Date Taking? Authorizing Provider  clindamycin-benzoyl peroxide (BENZACLIN) gel Apply 1 application topically every other day. 07/05/19  Yes [provider]  tretinoin (RETIN-A) 0.05 % cream Apply topically as directed. 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   Social Determinants of Health   Financial Resource Strain: Not on file  Food Insecurity: Not on file  Transportation Needs: Not on file  Physical Activity: Not on file  Stress: Not on file  Social Connections: Not on file  Intimate Partner Violence: Not on file    Review of Systems Per HPI  Objective:   Vitals:   04/16/21 1143  BP: 122/84  Pulse: 97  Resp: 16  Temp: 98.2 F (36.8 C)  TempSrc: Temporal  SpO2: 96%  Weight: 174 lb 9.6 oz (79.2 kg)  Height: 6' (1.829 m)     Physical Exam Vitals reviewed.  Constitutional:      Appearance: He is well-developed.  HENT:     Head: Normocephalic and atraumatic.  Neck:     Vascular: No carotid bruit or JVD.  Cardiovascular:     Rate and Rhythm: Normal rate and regular rhythm.     Heart sounds: Normal heart sounds. No murmur heard. Pulmonary:     Effort: Pulmonary effort is normal.     Breath sounds: Normal breath sounds. No rales.  Chest:    Musculoskeletal:     Right lower leg: No edema.     Left lower leg: No edema.  Skin:    General: Skin is warm and dry.  Neurological:     Mental Status: He is alert and  oriented to person, place, and time.  Psychiatric:        Mood and Affect: Mood normal.       Assessment & Plan:  Philip Spencer is a 31 y.o. male . Pectus excavatum - Plan: Ambulatory referral to Physical Therapy  -Status post Nuss procedure as above.  Appears to be recovering well.  Some intermittent swelling of the left upper chest wall wound, has been discussed with his treating surgeon and he plans to follow-up with him on Monday if not significantly improved.  RTC/ER precautions given if acute worsening locally.  Also plan on PT as above, referral sent.  No orders of the defined types were placed in this encounter.  Patient Instructions  Glad to hear things went well with the procedure.  I placed a referral to physical therapy if needed, but if other physical therapy referral needed please let me know.  I do agree with following up with your surgeon for the left chest wound as they are most familiar with your care.  Let me know if I can help further and take care    Signed,   Meredith Staggers, MD [facility], [facility] 04/16/21 9:45 PM

## 2021-04-16 NOTE — Patient Instructions (Signed)
Glad to hear things went well with the procedure.  I placed a referral to physical therapy if needed, but if other physical therapy referral needed please let me know.  I do agree with following up with your surgeon for the left chest wound as they are most familiar with your care.  Let me know if I can help further and take care

## 2021-04-17 DIAGNOSIS — R0789 Other chest pain: Secondary | ICD-10-CM | POA: Diagnosis not present

## 2021-04-28 DIAGNOSIS — R0789 Other chest pain: Secondary | ICD-10-CM | POA: Diagnosis not present

## 2021-04-30 DIAGNOSIS — R0789 Other chest pain: Secondary | ICD-10-CM | POA: Diagnosis not present

## 2021-05-04 DIAGNOSIS — R0789 Other chest pain: Secondary | ICD-10-CM | POA: Diagnosis not present

## 2021-05-06 DIAGNOSIS — R0789 Other chest pain: Secondary | ICD-10-CM | POA: Diagnosis not present

## 2021-05-12 DIAGNOSIS — R0789 Other chest pain: Secondary | ICD-10-CM | POA: Diagnosis not present

## 2021-05-19 DIAGNOSIS — R0789 Other chest pain: Secondary | ICD-10-CM | POA: Diagnosis not present

## 2021-05-28 DIAGNOSIS — R0789 Other chest pain: Secondary | ICD-10-CM | POA: Diagnosis not present

## 2021-06-02 DIAGNOSIS — R0789 Other chest pain: Secondary | ICD-10-CM | POA: Diagnosis not present

## 2021-06-04 DIAGNOSIS — R0789 Other chest pain: Secondary | ICD-10-CM | POA: Diagnosis not present

## 2021-06-08 DIAGNOSIS — R0789 Other chest pain: Secondary | ICD-10-CM | POA: Diagnosis not present

## 2021-06-11 DIAGNOSIS — R0789 Other chest pain: Secondary | ICD-10-CM | POA: Diagnosis not present

## 2021-06-15 DIAGNOSIS — R0789 Other chest pain: Secondary | ICD-10-CM | POA: Diagnosis not present

## 2021-06-18 DIAGNOSIS — R0789 Other chest pain: Secondary | ICD-10-CM | POA: Diagnosis not present

## 2021-06-24 DIAGNOSIS — R0789 Other chest pain: Secondary | ICD-10-CM | POA: Diagnosis not present

## 2021-10-29 ENCOUNTER — Ambulatory Visit (INDEPENDENT_AMBULATORY_CARE_PROVIDER_SITE_OTHER): Payer: BC Managed Care – PPO | Admitting: Family Medicine

## 2021-10-29 VITALS — BP 130/70 | HR 70 | Temp 98.1°F | Resp 16 | Ht 72.0 in | Wt 178.4 lb

## 2021-10-29 DIAGNOSIS — E049 Nontoxic goiter, unspecified: Secondary | ICD-10-CM | POA: Diagnosis not present

## 2021-10-29 DIAGNOSIS — R591 Generalized enlarged lymph nodes: Secondary | ICD-10-CM

## 2021-10-29 DIAGNOSIS — Z23 Encounter for immunization: Secondary | ICD-10-CM | POA: Diagnosis not present

## 2021-10-29 DIAGNOSIS — M542 Cervicalgia: Secondary | ICD-10-CM

## 2021-10-29 DIAGNOSIS — R238 Other skin changes: Secondary | ICD-10-CM | POA: Diagnosis not present

## 2021-10-29 MED ORDER — KETOCONAZOLE 2 % EX SHAM: 1.0000 | MEDICATED_SHAMPOO | CUTANEOUS | 0 refills | Status: DC

## 2021-10-29 NOTE — Progress Notes (Signed)
Subjective:  Patient ID: Philip Spencer, male    DOB: 1990-12-16  Age: 31 y.o. MRN: [id number]  CC:  Chief Complaint  Patient presents with   Cyst    Pt reports cys on back Rt of his neck, notes former smoker so he expressed concern as he just notice this a few days ago, slightly sore    HPI Philip Spencer presents for   R neck cyst: Cyst on front part of R neck since teenager, no changes. No prior ultrasound/imaging.  Some soreness in both sides of neck past few weeks.  No sore throat, fever, or trouble swallowing.  New bump on R side of neck past few days - slight soreness,no d/c or redness.  Some scalp irritation, bumps in scalp at times. Hair thinning since age 54, father with hair loss.  No cough/hemoptysis. smoker [place]:[facility].   History There are no problems to display for this patient.  Past Medical History:  Diagnosis Date   Substance abuse (HCC)    clean/sober since 08/24/2012   No past surgical history on file. No Known Allergies Prior to Admission medications   Medication Sig Start Date End Date Taking? Authorizing Provider  tretinoin (RETIN-A) 0.05 % cream Apply topically as directed. 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   Social Determinants of Health   Financial Resource Strain: Not on file  Food Insecurity: Not on file  Transportation Needs: Not on file  Physical Activity: Not on file  Stress: Not on file  Social Connections: Not on file  Intimate Partner Violence: Not on file    Review of Systems   Objective:   Vitals:   10/29/21 1322  BP: 130/70  Pulse: 70  Resp: 16  Temp: 98.1 F (36.7 C)  TempSrc: Oral  SpO2: 98%  Weight: 178 lb 6.4 oz (80.9 kg)  Height: 6' (1.829 m)     Physical Exam Vitals reviewed.  Constitutional:      General: He is not in acute distress.    Appearance: Normal appearance. He is well-developed.  HENT:     Head: Normocephalic and atraumatic.  Neck:     Comments: Possible slight prominence of right lobe of thyroid without discrete cyst or nodule.  Small firm area on the right lateral neck, mobile.  No surrounding erythema. No supraclavicular adenopathy.  No other appreciable adenopathy or masses/nodules noted on the left neck.  C-spine nontender. Cardiovascular:  Rate and Rhythm: Normal rate.  Pulmonary:     Effort: Pulmonary effort is normal.  Skin:    Comments: Few small excoriated areas on scalp without flaking.  No open wounds.  Neurological:     Mental Status: He is alert and oriented to person, place, and time.  Psychiatric:        Mood and Affect: Mood normal.     Assessment & Plan:  Philip Spencer is a 31 y.o. male . Neck pain - Plan: TSH, US THYROID, CANCELED: US Soft Tissue Head/Neck (NON-THYROID)  Scalp irritation - Plan: ketoconazole (NIZORAL) 2 % shampoo  Lymphadenopathy of head and neck - Plan: CBC, TSH, US THYROID, CANCELED: US Soft Tissue Head/Neck (NON-THYROID)  Thyroid enlargement - Plan: US THYROID, CANCELED: US Soft Tissue Head/Neck (NON-THYROID)  Needs flu shot - Plan: Flu Vaccine QUAD 6+ mos PF IM (Fluarix  Quad PF), US THYROID, CANCELED: US Soft Tissue Head/Neck (NON-THYROID)  Possible reactive lymphadenopathy on the right neck, potentially related to scalp irritation which could be a component of seborrheic dermatitis.  Trial of ketoconazole shampoo, monitor for improvement over the next 2 weeks with RTC precautions if persistent or worsening  Slight fullness on right thyroid, check ultrasound thyroid which should also evaluate surrounding areas for possible lymphadenopathy.  Flu shot given.   Meds ordered this encounter  Medications   ketoconazole (NIZORAL) 2 % shampoo    Sig: Apply 1 Application topically 2 (two) times a week.    Dispense:  120 mL    Refill:  0   Patient Instructions  Use shampoo 2 times per week for possible seborrhea (irritated scalp) that can cause swollen lymph node.  If bump on neck increasing in size or any new/worsening symptoms return for recheck.  If that bump does not really solve in the next 2 weeks, return for recheck.  I will let you know if there are concerns on your labs.  We will also schedule an ultrasound of your neck.  Return to the clinic or go to the nearest emergency room if any of your symptoms worsen or new symptoms occur.     Signed,   Meredith Staggers, MD [facility], [facility] 10/29/21 1:55 PM

## 2021-10-29 NOTE — Patient Instructions (Signed)
Use shampoo 2 times per week for possible seborrhea (irritated scalp) that can cause swollen lymph node.  If bump on neck increasing in size or any new/worsening symptoms return for recheck.  If that bump does not really solve in the next 2 weeks, return for recheck.  I will let you know if there are concerns on your labs.  We will also schedule an ultrasound of your neck.  Return to the clinic or go to the nearest emergency room if any of your symptoms worsen or new symptoms occur.

## 2021-10-30 ENCOUNTER — Encounter: Payer: Self-pay | Admitting: Family Medicine

## 2021-10-30 ENCOUNTER — Ambulatory Visit
Admission: RE | Admit: 2021-10-30 | Discharge: 2021-10-30 | Disposition: A | Discharge status: Home / Self Care | Payer: BC Managed Care – PPO | Source: Ambulatory Visit | Attending: Family Medicine | Admitting: Family Medicine

## 2021-10-30 DIAGNOSIS — R591 Generalized enlarged lymph nodes: Secondary | ICD-10-CM

## 2021-10-30 DIAGNOSIS — E049 Nontoxic goiter, unspecified: Secondary | ICD-10-CM

## 2021-10-30 DIAGNOSIS — Z23 Encounter for immunization: Secondary | ICD-10-CM

## 2021-10-30 DIAGNOSIS — M542 Cervicalgia: Secondary | ICD-10-CM

## 2021-10-30 DIAGNOSIS — E041 Nontoxic single thyroid nodule: Secondary | ICD-10-CM | POA: Diagnosis not present

## 2021-11-27 ENCOUNTER — Ambulatory Visit: Payer: BC Managed Care – PPO | Admitting: Adult Health

## 2021-11-30 ENCOUNTER — Ambulatory Visit: Payer: BC Managed Care – PPO | Admitting: Family Medicine

## 2021-12-22 IMAGING — DX DG CHEST 2V
1 series · 1 of 1 positions shown · non-contrast
Comparison: 5689

CLINICAL DATA: Intermittent chest pain

EXAM:
CHEST - 2 VIEW

[chest pa]
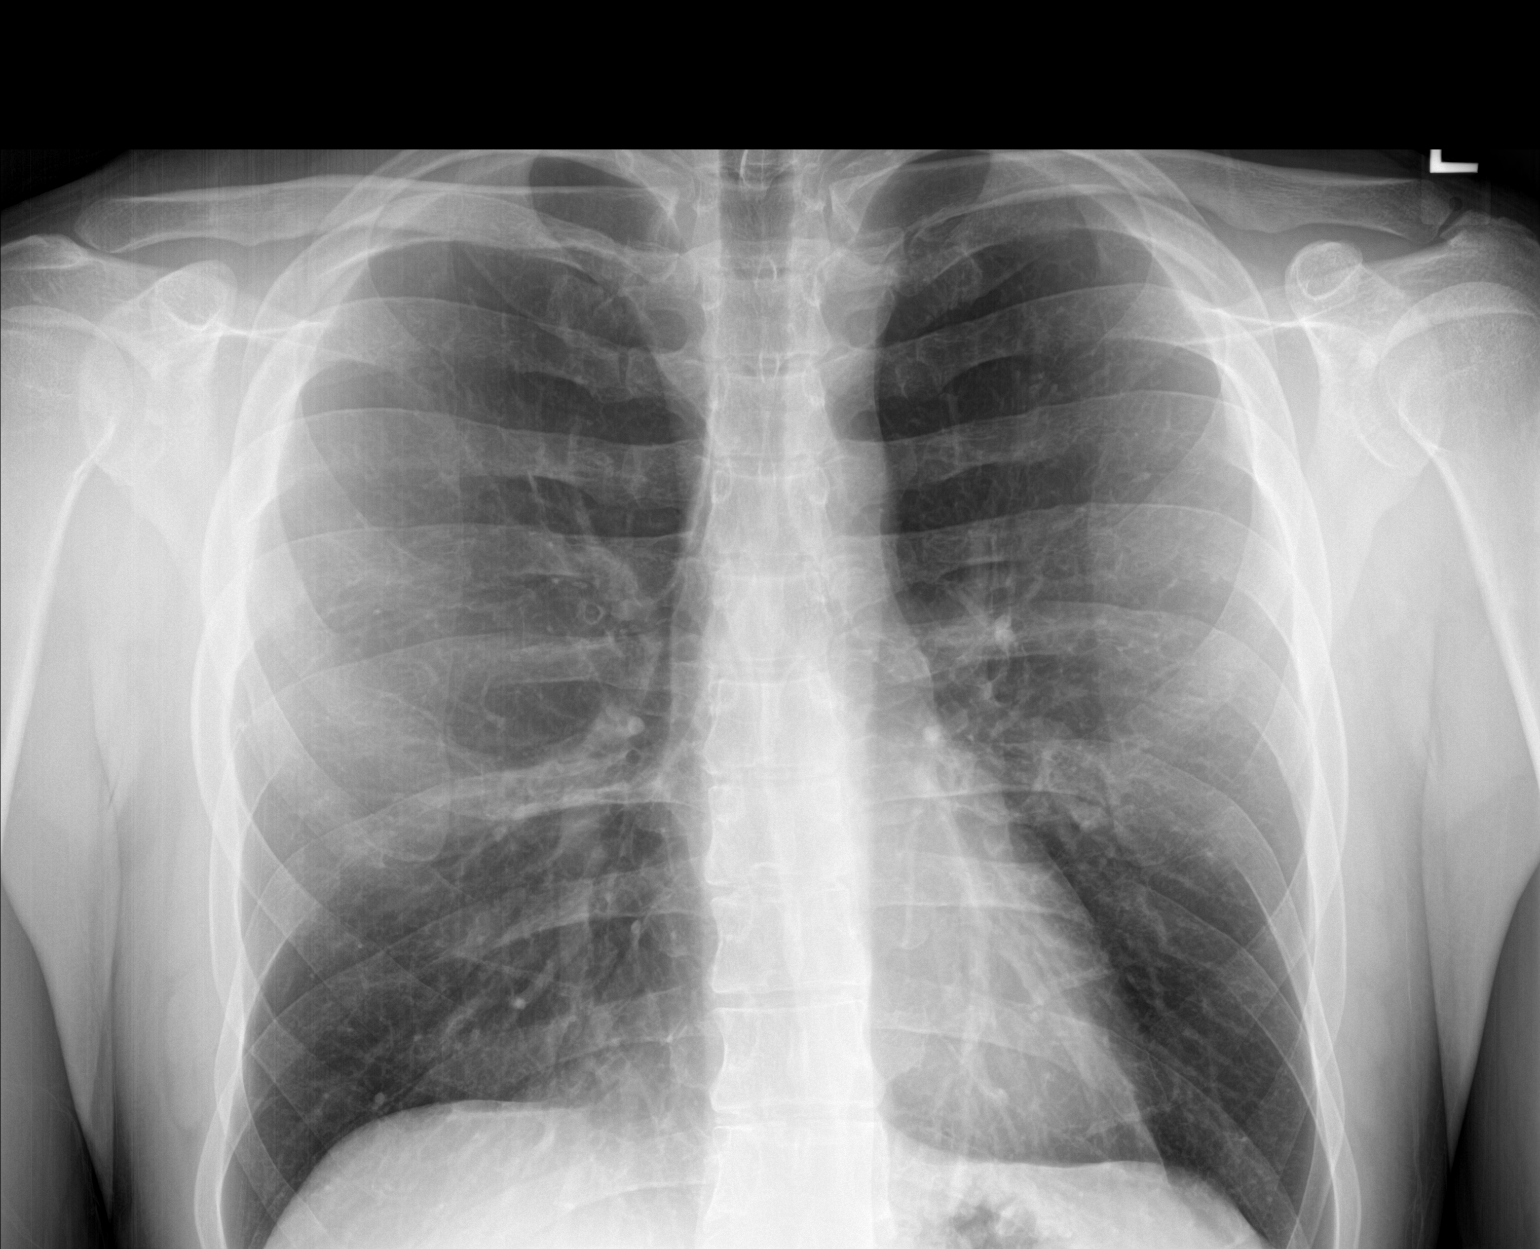

[1 of 1 positions shown; findings below may reference images not displayed]

FINDINGS: The heart size and mediastinal contours are within normal limits.
Both lungs are clear. No pleural effusion or pneumothorax. Pectus
excavatum.
IMPRESSION: No acute process in the chest.

## 2021-12-30 ENCOUNTER — Encounter: Payer: Self-pay | Admitting: Family Medicine

## 2021-12-30 DIAGNOSIS — L700 Acne vulgaris: Secondary | ICD-10-CM | POA: Diagnosis not present

## 2022-02-02 DIAGNOSIS — L700 Acne vulgaris: Secondary | ICD-10-CM | POA: Diagnosis not present

## 2022-02-02 DIAGNOSIS — Z79899 Other long term (current) drug therapy: Secondary | ICD-10-CM | POA: Diagnosis not present

## 2022-03-06 ENCOUNTER — Other Ambulatory Visit: Payer: Self-pay | Admitting: Family Medicine

## 2022-03-06 DIAGNOSIS — R238 Other skin changes: Secondary | ICD-10-CM

## 2022-03-09 ENCOUNTER — Other Ambulatory Visit: Payer: Self-pay | Admitting: Lab

## 2022-03-09 DIAGNOSIS — R238 Other skin changes: Secondary | ICD-10-CM

## 2022-03-09 MED ORDER — KETOCONAZOLE 2 % EX SHAM: 1.0000 | MEDICATED_SHAMPOO | CUTANEOUS | 0 refills | Status: DC

## 2022-03-16 ENCOUNTER — Encounter: Payer: Self-pay | Admitting: Family Medicine

## 2022-03-16 DIAGNOSIS — Q676 Pectus excavatum: Secondary | ICD-10-CM

## 2022-03-25 ENCOUNTER — Ambulatory Visit
Admission: RE | Admit: 2022-03-25 | Discharge: 2022-03-25 | Disposition: A | Discharge status: Home / Self Care | Payer: BLUE CROSS/BLUE SHIELD | Source: Ambulatory Visit | Attending: Family Medicine | Admitting: Family Medicine

## 2022-03-25 DIAGNOSIS — Q676 Pectus excavatum: Secondary | ICD-10-CM

## 2022-04-15 ENCOUNTER — Other Ambulatory Visit: Payer: Self-pay | Admitting: Family Medicine

## 2022-04-15 DIAGNOSIS — R238 Other skin changes: Secondary | ICD-10-CM

## 2022-06-29 ENCOUNTER — Encounter: Payer: Self-pay | Admitting: Family Medicine

## 2022-07-07 ENCOUNTER — Ambulatory Visit (INDEPENDENT_AMBULATORY_CARE_PROVIDER_SITE_OTHER): Payer: BLUE CROSS/BLUE SHIELD | Admitting: Family Medicine

## 2022-07-07 ENCOUNTER — Encounter: Payer: Self-pay | Admitting: Family Medicine

## 2022-07-07 VITALS — BP 126/74 | HR 90 | Temp 98.7°F | Ht 72.0 in | Wt 181.6 lb

## 2022-07-07 DIAGNOSIS — R21 Rash and other nonspecific skin eruption: Secondary | ICD-10-CM

## 2022-07-07 DIAGNOSIS — L237 Allergic contact dermatitis due to plants, except food: Secondary | ICD-10-CM | POA: Diagnosis not present

## 2022-07-07 MED ORDER — CEPHALEXIN 500 MG PO CAPS: 500.0000 mg | ORAL_CAPSULE | Freq: Two times a day (BID) | ORAL | 0 refills | Status: DC

## 2022-07-07 MED ORDER — PREDNISONE 20 MG PO TABS: ORAL_TABLET | ORAL | 0 refills | Status: DC

## 2022-07-07 NOTE — Telephone Encounter (Signed)
Pt scheduled for 1:20 today

## 2022-07-07 NOTE — Patient Instructions (Signed)
Start prednisone for poison ivy, antibiotic for possible early cellulitis or skin infection in some areas.  Keep areas clean, soap and water cleansing at least once per day.  If you notice any fever, spreading of redness beyond where it is now or other new symptoms be seen.  I expect you to be feeling better in the next few days.  Take care!  Poison Ivy Dermatitis Poison ivy dermatitis is irritation and swelling (inflammation) of the skin caused by chemicals in the leaves of the poison ivy plant. The skin reaction often involves redness, blisters, and extreme itching. What are the causes? This condition is caused by a chemical (urushiol) found in the sap of the poison ivy plant. This chemical is sticky and can easily spread to people, animals, and objects. You can get poison ivy dermatitis by: Having direct contact with a poison ivy plant. Touching animals, other people, or objects that have come in contact with poison ivy and have the chemical on them. What increases the risk? This condition is more likely to develop in people who: Are outdoors often in wooded or [place] areas. Go outdoors without wearing protective clothing, such as closed shoes, long pants, and a long-sleeved shirt. What are the signs or symptoms? Symptoms of this condition include: Redness of the skin. Extreme itching. A rash that often includes bumps and blisters. The rash usually appears 48 hours after exposure, if you have been exposed before. If this is the first time you have been exposed, the rash may not appear until a week after exposure. Swelling. This may occur if the reaction is more severe. Symptoms usually last for 1-2 weeks. However, the first time you develop this condition, symptoms may last 3-4 weeks. How is this diagnosed? This condition may be diagnosed based on your symptoms and a physical exam. Your health care provider may also ask you about any recent outdoor activity. How is this treated? Treatment  for this condition will vary depending on how severe it is. Treatment may include: Hydrocortisone cream or calamine lotion to relieve itching. Oatmeal baths to soothe the skin. Medicines, such as over-the-counter antihistamine tablets. Oral or injected steroid medicine, for more severe reactions. Follow these instructions at home: Medicines Take or apply over-the-counter and prescription medicines only as told by your health care provider. Use hydrocortisone cream or calamine lotion as needed to soothe the skin and relieve itching. General instructions Do not scratch or rub your skin. Apply a cold, wet cloth (cold compress) to the affected areas or take baths in cool water. This will help with itching. Avoid hot baths and showers. Take oatmeal baths as needed. Use colloidal oatmeal. You can get this at your local pharmacy or grocery store. Follow the instructions on the packaging. Wash all clothes, bedsheets, towels, and blankets you were in contact with between your exposure and appearance of the rash. Check the affected area every day for signs of infection. Check for: More redness, swelling, or pain. Fluid or blood. Warmth. Pus or a bad smell. Keep all follow-up visits. Your health care provider may want to see how your skin is progressing with treatment. How is this prevented?  Learn to identify the poison ivy plant and avoid contact with the plant. This plant can be recognized by the number of leaves. Generally, poison ivy has three leaves with flowering branches on a single stem. The leaves are typically glossy, and they have jagged edges that come to a point. If you have been exposed to poison  ivy, thoroughly wash with soap and water right away. You have about 30 minutes to remove the plant resin before it will cause the rash. Be sure to wash under your fingernails, because any plant resin there will continue to spread the rash. When hiking or camping, wear clothes that will help you  to avoid skin exposure. This includes long pants, a long-sleeved shirt, long socks, and hiking boots. You can also apply preventive lotion to your skin to help limit exposure. If you suspect that your clothes or outdoor gear came in contact with poison ivy, rinse them off outside with a garden hose before you bring them inside your house. When doing yard work or gardening, wear gloves, long sleeves, long pants, and boots. Wash your garden tools and gloves if they come in contact with poison ivy. If you suspect that your pet has come into contact with poison ivy, wash them with pet shampoo and water. Make sure to wear gloves while washing your pet. Contact a health care provider if: You have open sores in the rash area. You have any signs of infection. You have redness that spreads beyond the rash area. You have a fever. You have a rash over a large area of your body. You have a rash on your eyes, mouth, or genitals. You have a rash that does not improve after a few weeks. Get help right away if: Your face swells or your eyes swell shut. You have trouble breathing. You have trouble swallowing. These symptoms may be an emergency. Get help right away. Call 911. Do not wait to see if the symptoms will go away. Do not drive yourself to the hospital. This information is not intended to replace advice given to you by your health care provider. Make sure you discuss any questions you have with your health care provider. Document Revised: 07/23/2021 Document Reviewed: 07/23/2021 Elsevier Patient Education  2023 [employer].

## 2022-07-07 NOTE — Progress Notes (Signed)
Subjective:  Patient ID: Philip Spencer, male    DOB: 06/12/1990  Age: 32 y.o. MRN: [id number]  CC:  Chief Complaint  Patient presents with   Poison Ivy    Pt has a severe poison ivy rash covering most of his arms legs and torso tried OTC meds    HPI Philip Spencer presents for   Contact dermatitis/rash Started 9-10 days ago- walking on trail with shorts, started with rash on inside of legs, initially treated with cortisone, poison ivy wash, spray.  Spread to both arms, neck, hands. No face, or genital involvement. Not on  No fever. Cold symptoms past few days. Rash on hand yesterday. No plantar foot rash or mouth rash.  No sick contacts.  Similar flare about 12 years ago - spread all over at that time.       History There are no problems to display for this patient.  Past Medical History:  Diagnosis Date   Substance abuse (HCC)    clean/sober since 08/24/2012   No past surgical history on file. No Known Allergies Prior to Admission medications   Medication Sig Start Date End Date Taking? Authorizing Provider  ketoconazole (NIZORAL) 2 % shampoo APPLY 1 APPLICATION TOPICALLY 2 (TWO) TIMES A WEEK. 03/11/22  Yes Shade Flood, MD  ketoconazole (NIZORAL) 2 % shampoo APPLY 1 APPLICATION TOPICALLY 2 (TWO) TIMES A WEEK. 04/15/22  Yes Shade Flood, MD  tretinoin (RETIN-A) 0.05 % cream Apply topically as directed. 07/05/19  Yes [provider]   Social History   Socioeconomic History   Marital status: Single    Spouse name: Not on file   Number of children: 0   Years of education: Not on file   Highest education level: Not on file  Occupational History   Occupation: sales +    Comment: [employer]  Tobacco Use   Smoking status: Former    Packs/day: 0.50    Years: 10.00    Additional pack years: 0.00    Total pack years: 5.00    Types: Cigarettes    Quit date: 04/13/2019    Years since quitting: 3.2   Smokeless tobacco: Never  Vaping Use   Vaping Use: Former    Substances: Nicotine, Flavoring  Substance and Sexual Activity   Alcohol use: Yes    Comment: occasionally   Drug use: Yes    Types: Benzodiazepines, Marijuana, Other-see comments    Comment: Clean since 08/24/2012 (psychedelics, opiods)   Sexual activity: Not Currently  Other Topics Concern   Not on file  Social History Narrative   Lives with his parents.   Social Determinants of Health   Financial Resource Strain: Not on file  Food Insecurity: Not on file  Transportation Needs: Not on file  Physical Activity: Not on file  Stress: Not on file  Social Connections: Not on file  Intimate Partner Violence: Not on file    Review of Systems   Objective:   Vitals:   07/07/22 1335  BP: 126/74  Pulse: 90  Temp: 98.7 F (37.1 C)  TempSrc: Temporal  SpO2: 98%  Weight: 181 lb 9.6 oz (82.4 kg)  Height: 6' (1.829 m)     Physical Exam             Assessment & Plan:  Philip Spencer is a 32 y.o. male . Rash - Plan: cephALEXin (KEFLEX) 500 MG capsule, predniSONE (DELTASONE) 20 MG tablet  Contact dermatitis due to poison ivy - Plan:  cephALEXin (KEFLEX) 500 MG capsule, predniSONE (DELTASONE) 20 MG tablet  Appears to have spreading dermatitis, contact dermatitis with poison ivy.  Few areas with some surrounding erythema, recent fatigue, symptoms of possible viral syndrome, suspect  early cellulitis.  Palmar rash noted but no genital rash and no plantar foot rash, no rash of throat.  Differential of coxsackievirus but less likely with isolated hand symptoms.  -Start Keflex for possible early cellulitis, start prednisone for contact dermatitis with potential side effects and risk discussed.  RTC/ER precautions if any new or worsening symptoms.  Meds ordered this encounter  Medications   cephALEXin (KEFLEX) 500 MG capsule    Sig: Take 1 capsule (500 mg total) by mouth 2 (two) times daily.    Dispense:  14 capsule    Refill:  0   predniSONE (DELTASONE) 20 MG tablet     Sig: 3 by mouth for 3 days, then 2 by mouth for 2 days, then 1 by mouth for 2 days, then 1/2 by mouth for 2 days.    Dispense:  16 tablet    Refill:  0   There are no Patient Instructions on file for this visit.    Signed,   Meredith Staggers, MD [facility], [facility] 07/07/22 2:08 PM

## 2022-07-28 ENCOUNTER — Encounter: Payer: Self-pay | Admitting: Family Medicine

## 2022-07-29 ENCOUNTER — Emergency Department (HOSPITAL_BASED_OUTPATIENT_CLINIC_OR_DEPARTMENT_OTHER): Payer: BLUE CROSS/BLUE SHIELD

## 2022-07-29 ENCOUNTER — Emergency Department (HOSPITAL_BASED_OUTPATIENT_CLINIC_OR_DEPARTMENT_OTHER)
Admission: EM | Admit: 2022-07-29 | Discharge: 2022-07-29 | Disposition: A | Discharge status: Home / Self Care | Payer: BLUE CROSS/BLUE SHIELD | Attending: Emergency Medicine | Admitting: Emergency Medicine

## 2022-07-29 ENCOUNTER — Ambulatory Visit (INDEPENDENT_AMBULATORY_CARE_PROVIDER_SITE_OTHER): Payer: BLUE CROSS/BLUE SHIELD | Admitting: Family Medicine

## 2022-07-29 ENCOUNTER — Encounter (HOSPITAL_BASED_OUTPATIENT_CLINIC_OR_DEPARTMENT_OTHER): Payer: Self-pay | Admitting: Emergency Medicine

## 2022-07-29 ENCOUNTER — Ambulatory Visit: Payer: BLUE CROSS/BLUE SHIELD | Admitting: Family Medicine

## 2022-07-29 ENCOUNTER — Other Ambulatory Visit: Payer: Self-pay

## 2022-07-29 VITALS — BP 126/70 | HR 90 | Temp 98.7°F | Ht 72.0 in | Wt 177.4 lb

## 2022-07-29 DIAGNOSIS — L03012 Cellulitis of left finger: Secondary | ICD-10-CM | POA: Insufficient documentation

## 2022-07-29 DIAGNOSIS — S61002A Unspecified open wound of left thumb without damage to nail, initial encounter: Secondary | ICD-10-CM | POA: Diagnosis not present

## 2022-07-29 MED ORDER — LIDOCAINE HCL (PF) 1 % IJ SOLN
10.0000 mL | Freq: Once | INTRAMUSCULAR | Status: AC
  Administered 2022-07-29: 10 mL
  Filled 2022-07-29: qty 10

## 2022-07-29 NOTE — ED Provider Notes (Signed)
[facility] AT [facility] Provider Note   CSN: [id number] Arrival date & time: 07/29/22  1655     History  Chief Complaint  Patient presents with   Paronychia    Philip Spencer is a 32 y.o. male.  Patient with no pertinent past medical history presents today with complaints of left thumb pain.  He states that a few days ago he was mowing his yard and hit the handlebars of the mower on his left thumb causing him to chip his thumbnail. He states that he pulled off the piece of the chipped nail without issue, however in the past few days he has continued to have worsening pain in the thumb area with associated swelling. The area has not been draining. Denies any pain to the pad of the thumb. Denies fevers or chills. Denies any other injuries or complaints.   The history is provided by the patient. No language interpreter was used.       Home Medications Prior to Admission medications   Medication Sig Start Date End Date Taking? Authorizing Provider  cephALEXin (KEFLEX) 500 MG capsule Take 1 capsule (500 mg total) by mouth 2 (two) times daily. 07/07/22   Shade Flood, MD  ketoconazole (NIZORAL) 2 % shampoo APPLY 1 APPLICATION TOPICALLY 2 (TWO) TIMES A WEEK. 04/15/22   Shade Flood, MD  predniSONE (DELTASONE) 20 MG tablet 3 by mouth for 3 days, then 2 by mouth for 2 days, then 1 by mouth for 2 days, then 1/2 by mouth for 2 days. 07/07/22   Shade Flood, MD  tretinoin (RETIN-A) 0.05 % cream Apply topically as directed. 07/05/19   [provider]      Allergies    Patient has no known allergies.    Review of Systems   Review of Systems  Musculoskeletal:  Positive for arthralgias.  All other systems reviewed and are negative.   Physical Exam Updated Vital Signs BP (!) 161/108 (BP Location: Right Arm)   Pulse 90   Temp 98.6 F (37 C) (Oral)   Resp 17   SpO2 98%  Physical Exam Vitals and nursing note reviewed.  Constitutional:       General: He is not in acute distress.    Appearance: Normal appearance. He is normal weight. He is not ill-appearing, toxic-appearing or diaphoretic.  HENT:     Head: Normocephalic and atraumatic.  Cardiovascular:     Rate and Rhythm: Normal rate.  Pulmonary:     Effort: Pulmonary effort is normal. No respiratory distress.  Musculoskeletal:        General: Normal range of motion.     Cervical back: Normal range of motion.  Skin:    General: Skin is warm and dry.     Comments: TTP of the medial portion of the left thumbnail with obvious fluctuance with surrounding induration consistent with paronychia. No drainage. No swelling or TTP of the pad of the left thumb. ROM intact to the thumb without pain.   Neurological:     General: No focal deficit present.     Mental Status: He is alert.  Psychiatric:        Mood and Affect: Mood normal.        Behavior: Behavior normal.     ED Results / Procedures / Treatments   Labs (all labs ordered are listed, but only abnormal results are displayed) Labs Reviewed - No data to display  EKG None  Radiology DG Finger  Thumb Left  Result Date: 07/29/2022 CLINICAL DATA:  Left thumb injury and paronychia EXAM: LEFT THUMB 2+V COMPARISON:  None Available. FINDINGS: Soft tissue irregularity with associated 7 mm mildly hyperdense round lesion about the tip of the thumb. This could represent a hematoma small abscess. No fracture or dislocation. No evidence of osteomyelitis. IMPRESSION: No acute osseous abnormality. Soft tissue irregularity with associated 7 mm mildly hyperdense round lesion about the tip of the thumb. This could represent a hematoma small abscess. Electronically Signed   By: Minerva Fester M.D.   On: 07/29/2022 17:49    Procedures Drain paronychia  Date/Time: 07/29/2022 10:00 PM  Performed by: Silva Bandy, PA-C Authorized by: Silva Bandy, PA-C  Consent: Verbal consent obtained. Risks and benefits: risks, benefits and  alternatives were discussed Consent given by: patient Patient understanding: patient states understanding of the procedure being performed Patient consent: the patient's understanding of the procedure matches consent given Imaging studies: imaging studies available Patient identity confirmed: verbally with patient Local anesthesia used: yes Anesthesia: nerve block  Anesthesia: Local anesthesia used: yes Local Anesthetic: lidocaine 1% without epinephrine Anesthetic total: 4 mL Patient tolerance: patient tolerated the procedure well with no immediate complications       Medications Ordered in ED Medications  lidocaine (PF) (XYLOCAINE) 1 % injection 10 mL (10 mLs Infiltration Given 07/29/22 1919)    ED Course/ Medical Decision Making/ A&P                             Medical Decision Making Amount and/or Complexity of Data Reviewed Radiology: ordered.  Risk Prescription drug management.   Patient presents today with a paronychia of the left thumb.  He is afebrile, nontoxic-appearing, and in no acute distress with reassuring vital signs.  X-ray imaging obtained of same which has resulted and reveals  No acute osseous abnormality.   Soft tissue irregularity with associated 7 mm mildly hyperdense round lesion about the tip of the thumb. This could represent a hematoma small abscess.   I have personally reviewed and interpreted this imaging and agree with radiology interpretation.  Patient's paronychia has been drained per above procedure with purulent drainage and relief of pain.  No signs of felon.  Patient has no comorbid conditions to impact wound healing and therefore there is no indication for antibiotics. Recommend wound recheck in 2 days.  Patient given materials for dressing changes and extractions for same.  Recommend Epsom salt soaks and cleaning with soap and water regularly. Evaluation and diagnostic testing in the emergency department does not suggest an emergent  condition requiring admission or immediate intervention beyond what has been performed at this time.  Plan for discharge with close PCP follow-up.  Patient is understanding and amenable with plan, educated on red flag symptoms that would prompt immediate return.  Patient discharged in stable condition.   Final Clinical Impression(s) / ED Diagnoses Final diagnoses:  Paronychia of left thumb    Rx / DC Orders ED Discharge Orders     None     An After Visit Summary was printed and given to the patient.     Vear Clock 07/29/22 2203    Rondel Baton, MD 07/30/22 [phone]

## 2022-07-29 NOTE — Progress Notes (Signed)
Subjective:  Patient ID: Philip Spencer, male    DOB: October 20, 1990  Age: 32 y.o. MRN: [id number]  CC:  Chief Complaint  Patient presents with   Hand Injury    Pt notes mowing lawn and busted thumb nail on Lt hand, it is now red and irritated with drainage     HPI Philip Spencer presents for   Left hand pain: Mowing with self propelled lawn mower last Friday - 5/17, handle form mower handle hit L thumb, spilt distal nail on ulnar aspect - hanging off - removed fragment with tweezers, cleaned with alcohol and neosporin and bandage. Has been bumping wound on objects, increased swelling and growth of tissue in area since initial injury - started 2 days after. Increasing swelling into base of thumb, pad of thumb and and growth of tissue at area of wound.  No fever.  Increased pain - throbbing.  6 advil last night (200mg ).    R hand dominant.  History There are no problems to display for this patient.  Past Medical History:  Diagnosis Date   Substance abuse (HCC)    clean/sober since 08/24/2012   No past surgical history on file. No Known Allergies Prior to Admission medications   Medication Sig Start Date End Date Taking? Authorizing Provider  cephALEXin (KEFLEX) 500 MG capsule Take 1 capsule (500 mg total) by mouth 2 (two) times daily. 07/07/22  Yes Shade Flood, MD  ketoconazole (NIZORAL) 2 % shampoo APPLY 1 APPLICATION TOPICALLY 2 (TWO) TIMES A WEEK. 04/15/22  Yes Shade Flood, MD  predniSONE (DELTASONE) 20 MG tablet 3 by mouth for 3 days, then 2 by mouth for 2 days, then 1 by mouth for 2 days, then 1/2 by mouth for 2 days. 07/07/22  Yes Shade Flood, MD  tretinoin (RETIN-A) 0.05 % cream Apply topically as directed. 07/05/19  Yes [provider]   Social History   Socioeconomic History   Marital status: Single    Spouse name: Not on file   Number of children: 0   Years of education: Not on file   Highest education level: Some college, no degree  Occupational  History   Occupation: Airline pilot +    Comment: [employer]  Tobacco Use   Smoking status: Former    Packs/day: 0.50    Years: 10.00    Additional pack years: 0.00    Total pack years: 5.00    Types: Cigarettes    Quit date: 04/13/2019    Years since quitting: 3.2   Smokeless tobacco: Never  Vaping Use   Vaping Use: Former   Substances: Nicotine, Flavoring  Substance and Sexual Activity   Alcohol use: Yes    Comment: occasionally   Drug use: Yes    Types: Benzodiazepines, Marijuana, Other-see comments    Comment: Clean since 08/24/2012 (psychedelics, opiods)   Sexual activity: Not Currently  Other Topics Concern   Not on file  Social History Narrative   Lives with his parents.   Social Determinants of Health   Financial Resource Strain: Low Risk  (07/29/2022)   Overall Financial Resource Strain (CARDIA)    Difficulty of Paying Living Expenses: Not very hard  Food Insecurity: Patient Declined (07/29/2022)   Hunger Vital Sign    Worried About Running Out of Food in the Last Year: Patient declined    Ran Out of Food in the Last Year: Patient declined  Transportation Needs: Patient Declined (07/29/2022)   PRAPARE - Transportation  Lack of Transportation (Medical): Patient declined    Lack of Transportation (Non-Medical): Patient declined  Physical Activity: Sufficiently Active (07/29/2022)   Exercise Vital Sign    Days of Exercise per Week: 4 days    Minutes of Exercise per Session: 40 min  Stress: No Stress Concern Present (07/29/2022)   [employer] of Occupational Health - Occupational Stress Questionnaire    Feeling of Stress : Not at all  Social Connections: Unknown (07/29/2022)   Social Connection and Isolation Panel [NHANES]    Frequency of Communication with Friends and Family: More than three times a week    Frequency of Social Gatherings with Friends and Family: Three times a week    Attends Religious Services: Patient declined    Active Member of Clubs or  Organizations: No    Attends Banker Meetings: Not on file    Marital Status: Never married  Intimate Partner Violence: Not on file    Review of Systems   Objective:   Vitals:   07/29/22 1539  BP: 126/70  Pulse: 90  Temp: 98.7 F (37.1 C)  TempSrc: Temporal  SpO2: 97%  Weight: 177 lb 6.4 oz (80.5 kg)  Height: 6' (1.829 m)     Physical Exam Constitutional:      General: He is not in acute distress.    Appearance: Normal appearance. He is well-developed.  HENT:     Head: Normocephalic and atraumatic.  Cardiovascular:     Rate and Rhythm: Normal rate.  Pulmonary:     Effort: Pulmonary effort is normal.  Musculoskeletal:     Comments: See photos, tender to palpation at lateral nail fold with swelling, erythema, tender with slight tightness distal pad, proximal appears soft, minimal tenderness distally only.  Granulation tissue with white-yellow exudate noted.  Neurological:     Mental Status: He is alert and oriented to person, place, and time.  Psychiatric:        Mood and Affect: Mood normal.           Assessment & Plan:  Philip Spencer is a 32 y.o. male . Open wound of left thumb, initial encounter  Paronychia of left thumb Wound of thumb with partial excision of nail last week with secondary infection, paronychia, granulation tissue and area of wound.  Does have some swelling and pain into the distal thumb pad, less likely true felon at this time but risk of progression with worsening swelling, pain recently.  Recommend emergency room evaluation for likely incision and drainage, possible excision of granulation tissue, antibiotic treatment and further evaluation.  No orders of the defined types were placed in this encounter.  Patient Instructions  Sorry to hear about the thumb injury but I do think there is an infection into the nail fold and possibly moving towards the pad of your finger.  The swollen area at the wound appears to be granulation  tissue which may need to be trimmed in addition to procedure for swelling of the skin next to the nail.  This may be best evaluated through the emergency room today if you do need follow-up after the ER visit, I am here next week.  Hang in there.   Paronychia Paronychia is an infection of the skin that surrounds a nail. It usually affects the skin around a fingernail, but it may also occur near a toenail. It often causes pain and swelling around the nail. In some cases, a collection of pus (abscess) can form near or under  the nail.  This condition may develop suddenly, or it may develop gradually over a longer period. In most cases, paronychia is not serious, and it will clear up with treatment. What are the causes? This condition may be caused by bacteria or a fungus, such as yeast. The bacteria or fungus can enter the body through an opening in the skin, such as a cut or a hangnail, and cause an infection in your fingernail or toenail. Other causes may include: Recurrent injury to the fingernail or toenail area. Irritation of the base and sides of the nail (cuticle). Injury and irritation can result in inflammation, swelling, and thickened skin around the nail. What increases the risk? This condition is more likely to develop in people who: Get their hands wet often, such as those who work as Fish farm manager, bartenders, or housekeepers. Bite their fingernails or cuticles. Have underlying skin conditions. Have hangnails or injured fingertips. Are exposed to irritants like detergents and other chemicals. Have diabetes. What are the signs or symptoms? Symptoms of this condition include: Redness and swelling of the skin near the nail. Tenderness around the nail when you touch the area. Pus-filled bumps under the cuticle. Fluid or pus under the nail. Throbbing pain in the area. How is this diagnosed? This condition is diagnosed with a physical exam. In some cases, a sample of pus may be tested to  determine what type of bacteria or fungus is causing the condition. How is this treated? Treatment depends on the cause and severity of your condition. If your condition is mild, it may clear up on its own in a few days or after soaking in warm water. If needed, treatment may include: Antibiotic medicine, if your infection is caused by bacteria. Antifungal medicine, if your infection is caused by a fungus. A procedure to drain pus from an abscess. Anti-inflammatory medicine (corticosteroids). Removal of part of an ingrown toenail. A bandage (dressing) may be placed over the affected area if an abscess or part of a nail has been removed. Follow these instructions at home: Wound care Keep the affected area clean. Soak the affected area in warm water if told to do so by your health care provider. You may be told to do this for 20 minutes, 2-3 times a day. Keep the area dry when you are not soaking it. Do not try to drain an abscess yourself. Follow instructions from your health care provider about how to take care of the affected area. Make sure you: Wash your hands with soap and water for at least 20 seconds before and after you change your dressing. If soap and water are not available, use hand sanitizer. Change your dressing as told by your health care provider. If you had an abscess drained, check the area every day for signs of infection. Check for: Redness, swelling, or pain. Fluid or blood. Warmth. Pus or a bad smell. Medicines  Take over-the-counter and prescription medicines only as told by your health care provider. If you were prescribed an antibiotic medicine, take it as told by your health care provider. Do not stop taking the antibiotic even if you start to feel better. General instructions Avoid contact with any skin irritants or allergens. Do not pick at the affected area. Keep all follow-up visits as told. This is important. Prevention To prevent this condition from  happening again: Wear rubber gloves when washing dishes or doing other tasks that require your hands to get wet. Wear gloves if your hands might come in  contact with cleaners or other chemicals. Avoid injuring your nails or fingertips. Do not bite your nails or tear hangnails. Do not cut your nails very short. Do not cut your cuticles. Use clean nail clippers or scissors when trimming nails. Contact a health care provider if: Your symptoms get worse or do not improve with treatment. You have continued or increased fluid, blood, or pus coming from the affected area. Your affected finger, toe, or joint becomes swollen or difficult to move. You have a fever or chills. There is redness spreading away from the affected area. Summary Paronychia is an infection of the skin that surrounds a nail. It often causes pain and swelling around the nail. In some cases, a collection of pus (abscess) can form near or under the nail. This condition may be caused by bacteria or a fungus. These germs can enter the body through an opening in the skin, such as a cut or a hangnail. If your condition is mild, it may clear up on its own in a few days. If needed, treatment may include medicine or a procedure to drain pus from an abscess. To prevent this condition from happening again, wear gloves if doing tasks that require your hands to get wet or to come in contact with chemicals. Also avoid injuring your nails or fingertips. This information is not intended to replace advice given to you by your health care provider. Make sure you discuss any questions you have with your health care provider. Document Revised: 05/26/2020 Document Reviewed: 05/26/2020 Elsevier Patient Education  2024 [employer] Inc.     Signed,   Meredith Staggers, MD [facility], [facility] 07/29/22 4:44 PM

## 2022-07-29 NOTE — Discharge Instructions (Signed)
As we discussed, your paronychia was drained in the ER today. I have given you materials to use for dressing changes. I recommend that you change your dressings every day. You can shower without your dressings, clean the wound with soap and water and pat dry. Keep the area clean and dry and covered with the antibiotic ointment that I have given you. You may also soak your thumb in Epsom salts to help reduce any additional swelling. I recommend that you take tylenol and ibuprofen as needed for swelling. Follow-up with your pcp in the next few days for a wound check  Return if development of any new or worsening symptoms

## 2022-07-29 NOTE — Telephone Encounter (Signed)
Pt has been scheduled.  °

## 2022-07-29 NOTE — Patient Instructions (Addendum)
Sorry to hear about the thumb injury but I do think there is an infection into the nail fold and possibly moving towards the pad of your finger.  The swollen area at the wound appears to be granulation tissue which may need to be trimmed in addition to procedure for swelling of the skin next to the nail.  This may be best evaluated through the emergency room today if you do need follow-up after the ER visit, I am here next week.  Hang in there.   Paronychia Paronychia is an infection of the skin that surrounds a nail. It usually affects the skin around a fingernail, but it may also occur near a toenail. It often causes pain and swelling around the nail. In some cases, a collection of pus (abscess) can form near or under the nail.  This condition may develop suddenly, or it may develop gradually over a longer period. In most cases, paronychia is not serious, and it will clear up with treatment. What are the causes? This condition may be caused by bacteria or a fungus, such as yeast. The bacteria or fungus can enter the body through an opening in the skin, such as a cut or a hangnail, and cause an infection in your fingernail or toenail. Other causes may include: Recurrent injury to the fingernail or toenail area. Irritation of the base and sides of the nail (cuticle). Injury and irritation can result in inflammation, swelling, and thickened skin around the nail. What increases the risk? This condition is more likely to develop in people who: Get their hands wet often, such as those who work as Fish farm manager, bartenders, or housekeepers. Bite their fingernails or cuticles. Have underlying skin conditions. Have hangnails or injured fingertips. Are exposed to irritants like detergents and other chemicals. Have diabetes. What are the signs or symptoms? Symptoms of this condition include: Redness and swelling of the skin near the nail. Tenderness around the nail when you touch the area. Pus-filled  bumps under the cuticle. Fluid or pus under the nail. Throbbing pain in the area. How is this diagnosed? This condition is diagnosed with a physical exam. In some cases, a sample of pus may be tested to determine what type of bacteria or fungus is causing the condition. How is this treated? Treatment depends on the cause and severity of your condition. If your condition is mild, it may clear up on its own in a few days or after soaking in warm water. If needed, treatment may include: Antibiotic medicine, if your infection is caused by bacteria. Antifungal medicine, if your infection is caused by a fungus. A procedure to drain pus from an abscess. Anti-inflammatory medicine (corticosteroids). Removal of part of an ingrown toenail. A bandage (dressing) may be placed over the affected area if an abscess or part of a nail has been removed. Follow these instructions at home: Wound care Keep the affected area clean. Soak the affected area in warm water if told to do so by your health care provider. You may be told to do this for 20 minutes, 2-3 times a day. Keep the area dry when you are not soaking it. Do not try to drain an abscess yourself. Follow instructions from your health care provider about how to take care of the affected area. Make sure you: Wash your hands with soap and water for at least 20 seconds before and after you change your dressing. If soap and water are not available, use hand sanitizer. Change your dressing as  told by your health care provider. If you had an abscess drained, check the area every day for signs of infection. Check for: Redness, swelling, or pain. Fluid or blood. Warmth. Pus or a bad smell. Medicines  Take over-the-counter and prescription medicines only as told by your health care provider. If you were prescribed an antibiotic medicine, take it as told by your health care provider. Do not stop taking the antibiotic even if you start to feel  better. General instructions Avoid contact with any skin irritants or allergens. Do not pick at the affected area. Keep all follow-up visits as told. This is important. Prevention To prevent this condition from happening again: Wear rubber gloves when washing dishes or doing other tasks that require your hands to get wet. Wear gloves if your hands might come in contact with cleaners or other chemicals. Avoid injuring your nails or fingertips. Do not bite your nails or tear hangnails. Do not cut your nails very short. Do not cut your cuticles. Use clean nail clippers or scissors when trimming nails. Contact a health care provider if: Your symptoms get worse or do not improve with treatment. You have continued or increased fluid, blood, or pus coming from the affected area. Your affected finger, toe, or joint becomes swollen or difficult to move. You have a fever or chills. There is redness spreading away from the affected area. Summary Paronychia is an infection of the skin that surrounds a nail. It often causes pain and swelling around the nail. In some cases, a collection of pus (abscess) can form near or under the nail. This condition may be caused by bacteria or a fungus. These germs can enter the body through an opening in the skin, such as a cut or a hangnail. If your condition is mild, it may clear up on its own in a few days. If needed, treatment may include medicine or a procedure to drain pus from an abscess. To prevent this condition from happening again, wear gloves if doing tasks that require your hands to get wet or to come in contact with chemicals. Also avoid injuring your nails or fingertips. This information is not intended to replace advice given to you by your health care provider. Make sure you discuss any questions you have with your health care provider. Document Revised: 05/26/2020 Document Reviewed: 05/26/2020 Elsevier Patient Education  2024 [employer].

## 2022-07-29 NOTE — ED Triage Notes (Signed)
Pt arrives to ED from PCP with left thumb injury and associated paronychia.

## 2022-09-07 ENCOUNTER — Other Ambulatory Visit: Payer: Self-pay | Admitting: Family Medicine

## 2022-09-07 DIAGNOSIS — R238 Other skin changes: Secondary | ICD-10-CM

## 2023-02-14 ENCOUNTER — Encounter: Payer: Self-pay | Admitting: Family Medicine

## 2023-02-14 DIAGNOSIS — Q676 Pectus excavatum: Secondary | ICD-10-CM

## 2023-02-15 NOTE — Telephone Encounter (Signed)
Agree with in person evaluation, that can be with any provider as I am out of town the next few days.  Also please clarify specific chest scan needed.  We did a 2 view chest x-ray last year, and I can place that order for [employer] without a visit if needed.

## 2023-02-16 NOTE — Addendum Note (Signed)
Addended by: Meredith Staggers R on: 02/16/2023 04:11 PM   Modules accepted: Orders

## 2023-02-16 NOTE — Telephone Encounter (Signed)
Patient sent copy of the RX from the original CXR order for evaluation for Nuss procedure. Please advise.

## 2023-02-16 NOTE — Telephone Encounter (Signed)
Called to make an appointment with the patient for cold like sxs/ sore throat no answer LM to call back so we can get him scheduled for evaluation.

## 2023-02-17 ENCOUNTER — Ambulatory Visit (INDEPENDENT_AMBULATORY_CARE_PROVIDER_SITE_OTHER)
Admission: RE | Admit: 2023-02-17 | Discharge: 2023-02-17 | Disposition: A | Discharge status: Home / Self Care | Payer: BLUE CROSS/BLUE SHIELD | Source: Ambulatory Visit | Attending: Family Medicine | Admitting: Family Medicine

## 2023-02-17 DIAGNOSIS — Q676 Pectus excavatum: Secondary | ICD-10-CM

## 2023-02-17 NOTE — Telephone Encounter (Signed)
Pt called to say take he was not told or unaware of the address in MyChart for X-Ray [address] was very rude and abruptly hung up. Although the address wasn't listed in the message, he clearly didn't understand walk in clinic. And the address must have not ben given to him.

## 2023-02-17 NOTE — Telephone Encounter (Signed)
Pt did get his scan done and figured out the location. No concerns any longer pt will be back next week

## 2023-02-18 ENCOUNTER — Telehealth: Payer: Self-pay

## 2023-02-18 ENCOUNTER — Telehealth: Payer: Self-pay | Admitting: Family Medicine

## 2023-02-18 NOTE — Telephone Encounter (Signed)
-----   Message from Shade Flood sent at 02/17/2023  6:31 PM EST ----- Results sent by MyChart, but will need to have this sent to his thoracic surgeon.  Please contact patient and see if he has location or fax number that is preferred.  Thanks

## 2023-02-18 NOTE — Telephone Encounter (Signed)
Pt returned your call.  

## 2023-02-18 NOTE — Telephone Encounter (Signed)
Lvm for patient to return my call.

## 2023-02-21 NOTE — Telephone Encounter (Signed)
Received response via MyChart, working on records request

## 2023-03-04 NOTE — Telephone Encounter (Signed)
error 

## 2023-03-13 ENCOUNTER — Other Ambulatory Visit: Payer: Self-pay | Admitting: Family Medicine

## 2023-03-13 DIAGNOSIS — R238 Other skin changes: Secondary | ICD-10-CM

## 2023-03-16 NOTE — Telephone Encounter (Signed)
 Copied from CRM [id number]. Topic: Medical Record Request - Other >> Mar 16, 2023 12:46 PM Truddie Crumble wrote: Reason for CRM: Pt called stating he would like a copy of a chest xray sent to a doctor named Macy Mis in [place]

## 2023-04-17 ENCOUNTER — Other Ambulatory Visit: Payer: Self-pay | Admitting: Family Medicine

## 2023-04-17 DIAGNOSIS — R238 Other skin changes: Secondary | ICD-10-CM

## 2023-04-18 NOTE — Telephone Encounter (Signed)
 Requested Prescriptions   Pending Prescriptions Disp Refills   ketoconazole  (NIZORAL ) 2 % shampoo [Pharmacy Med Name: KETOCONAZOLE  2% SHAMPOO] 120 mL 0    Sig: APPLY 1 APPLICATION TOPICALLY 2 (TWO) TIMES A WEEK.     Date of patient request: 04/18/2023 Last office visit: 07/29/2022 Upcoming visit: Visit date not found Date of last refill: 03/14/2023 Last refill amount: 

## 2023-04-19 NOTE — Telephone Encounter (Signed)
I do see that he has an appointment with dermatology in April.  Will refill ketoconazole for now.

## 2023-05-05 ENCOUNTER — Encounter: Payer: Self-pay | Admitting: Family Medicine

## 2023-05-06 ENCOUNTER — Ambulatory Visit (INDEPENDENT_AMBULATORY_CARE_PROVIDER_SITE_OTHER): Payer: BLUE CROSS/BLUE SHIELD | Admitting: Family Medicine

## 2023-05-06 ENCOUNTER — Encounter: Payer: Self-pay | Admitting: Family Medicine

## 2023-05-06 VITALS — BP 124/86 | HR 90 | Temp 98.0°F | Wt 168.0 lb

## 2023-05-06 DIAGNOSIS — G5139 Clonic hemifacial spasm, unspecified: Secondary | ICD-10-CM

## 2023-05-06 LAB — COMPREHENSIVE METABOLIC PANEL
ALT: 9 U/L (ref 0–53)
AST: 17 U/L (ref 0–37)
Albumin: 4.7 g/dL (ref 3.5–5.2)
Alkaline Phosphatase: 81 U/L (ref 39–117)
BUN: 12 mg/dL (ref 6–23)
CO2: 30 meq/L (ref 19–32)
Calcium: 9.6 mg/dL (ref 8.4–10.5)
Chloride: 105 meq/L (ref 96–112)
Creatinine, Ser: 1.01 mg/dL (ref 0.40–1.50)
GFR: 99.03 mL/min (ref >60.00)
Glucose, Bld: 98 mg/dL (ref 70–99)
Potassium: 4.4 meq/L (ref 3.5–5.1)
Sodium: 142 meq/L (ref 135–145)
Total Bilirubin: 0.9 mg/dL (ref 0.2–1.2)
Total Protein: 6.9 g/dL (ref 6.0–8.3)

## 2023-05-06 LAB — CBC WITH DIFFERENTIAL/PLATELET
Basophils Absolute: 0 10*3/uL (ref 0.0–0.1)
Basophils Relative: 0.8 % (ref 0.0–3.0)
Eosinophils Absolute: 0.1 10*3/uL (ref 0.0–0.7)
Eosinophils Relative: 2.8 % (ref 0.0–5.0)
HCT: 45.8 % (ref 39.0–52.0)
Hemoglobin: 15.8 g/dL (ref 13.0–17.0)
Lymphocytes Relative: 32.7 % (ref 12.0–46.0)
Lymphs Abs: 1.6 10*3/uL (ref 0.7–4.0)
MCHC: 34.6 g/dL (ref 30.0–36.0)
MCV: 90.6 fL (ref 78.0–100.0)
Monocytes Absolute: 0.3 10*3/uL (ref 0.1–1.0)
Monocytes Relative: 6.7 % (ref 3.0–12.0)
Neutro Abs: 2.7 10*3/uL (ref 1.4–7.7)
Neutrophils Relative %: 57 % (ref 43.0–77.0)
Platelets: 177 10*3/uL (ref 150.0–400.0)
RBC: 5.06 Mil/uL (ref 4.22–5.81)
RDW: 12.5 % (ref 11.5–15.5)
WBC: 4.8 10*3/uL (ref 4.0–10.5)

## 2023-05-06 LAB — SEDIMENTATION RATE: Sed Rate: 3 mm/h (ref 0–15)

## 2023-05-06 LAB — VITAMIN D 25 HYDROXY (VIT D DEFICIENCY, FRACTURES): VITD: 13.77 ng/mL — ABNORMAL LOW (ref 30.00–100.00)

## 2023-05-06 LAB — MAGNESIUM: Magnesium: 2 mg/dL (ref 1.5–2.5)

## 2023-05-06 MED ORDER — HYDROXYZINE PAMOATE 25 MG PO CAPS: 25.0000 mg | ORAL_CAPSULE | Freq: Every day | ORAL | 0 refills | Status: DC

## 2023-05-06 MED ORDER — VALACYCLOVIR HCL 1 G PO TABS: 1000.0000 mg | ORAL_TABLET | Freq: Two times a day (BID) | ORAL | 0 refills | Status: AC

## 2023-05-06 MED ORDER — PREDNISONE 20 MG PO TABS
ORAL_TABLET | ORAL | 0 refills | Status: DC
Start: 2023-05-06 — End: 2023-06-23

## 2023-05-06 NOTE — Progress Notes (Signed)
 Philip Spencer , 1990-06-07, 33 y.o., male MRN: [id number] Patient Care Team    Relationship Specialty Notifications Start End  Shade Flood, MD PCP - General Family Medicine  10/29/21     Chief Complaint  Patient presents with   Facial Spasms     Pt c/o of consistent facial spasms for over 4 hours yesterday. Pt states R side of mouth would move up and down. Has not experienced spasms anymore today. Denies pain/discomfort.      Subjective: Philip Spencer is a 33 y.o. Pt presents for an OV with complaints of RIGHT sided facial spasms. He reports yesterday, for about 4 hours the right side of his mouth was moving up/spasm.  He reports the spasm was pretty consistent at least 1 time a minute.  Not associated with any other symptoms. He denies any fevers, chills, headaches, ear pain, visual changes or mouth pain. He reports he does have a few areas in his lower anterior gumline/mucous membranes that he notices feels like bumps.  He has had a mass?  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  The mass removal was approximately 6 years ago per patient. He states he has had quite a few infections over the last couple months.  He reports these are staph infections on his skin and most are all resolved now.  He also was seen in December at an urgent care, for right sided submandibular swelling.  At that visit he had already been prescribed doxycycline for another infection.  Mono screening was negative at that visit.  Normal CMP and normal CBC with differential with the exception of mildly low WBC and low platelets. No record of childhood vaccines available. Patient reports he was on Accutane for about 10 months and he believes this suppressed his immune system. He has never experienced spasms in the past. Denies any neurological chronic illnesses. Reports a normal diet. He has had cold sores in the past, but not  recently.     07/07/2022    1:32 PM 10/29/2021    1:26 PM 04/16/2021   11:43 AM 09/01/2020    3:50 PM 04/30/2020    3:19 PM  Depression screen PHQ 2/9  Decreased Interest 0 0 0 0 0  Down, Depressed, Hopeless 0 0 0 0 0  PHQ - 2 Score 0 0 0 0 0  Altered sleeping 0   1   Tired, decreased energy 0   0   Change in appetite 0   0   Feeling bad or failure about yourself  0   1   Trouble concentrating 0   0   Moving slowly or fidgety/restless 0   0   Suicidal thoughts 0   0   PHQ-9 Score 0   2     No Known Allergies Social History   Social History Narrative   Lives with his parents.   Past Medical History:  Diagnosis Date   Substance abuse (HCC)    clean/sober since 08/24/2012   History reviewed. No pertinent surgical history. Family History  Problem Relation Age of Onset   Hypertension Father    Breast cancer Maternal Aunt    Leukemia Maternal Aunt    Allergies as of 05/06/2023   No Known Allergies      Medication List        Accurate as of May 06, 2023  3:49  PM. If you have any questions, ask your nurse or doctor.          STOP taking these medications    cephALEXin 500 MG capsule Commonly known as: KEFLEX Stopped by: Felix Pacini   tretinoin 0.05 % cream Commonly known as: RETIN-A Stopped by: Felix Pacini       TAKE these medications    hydrOXYzine 25 MG capsule Commonly known as: VISTARIL Take 1-2 capsules (25-50 mg total) by mouth at bedtime. Started by: Felix Pacini   ketoconazole 2 % shampoo Commonly known as: NIZORAL APPLY 1 APPLICATION TOPICALLY 2 (TWO) TIMES A WEEK.   predniSONE 20 MG tablet Commonly known as: DELTASONE 60 mg x2d, 40 mg x3d, 20 mg x3d, 10 mg x2d What changed: additional instructions Changed by: Felix Pacini   valACYclovir 1000 MG tablet Commonly known as: VALTREX Take 1 tablet (1,000 mg total) by mouth 2 (two) times daily for 10 days. Started by: Felix Pacini        All past medical history, surgical  history, allergies, family history, immunizations andmedications were updated in the EMR today and reviewed under the history and medication portions of their EMR.     ROS Negative, with the exception of above mentioned in HPI   Objective:  BP 124/86   Pulse 90   Temp 98 F (36.7 C)   Wt 168 lb (76.2 kg)   SpO2 96%   BMI 22.78 kg/m  Body mass index is 22.78 kg/m. Physical Exam Vitals and nursing note reviewed.  Constitutional:      General: He is not in acute distress.    Appearance: Normal appearance. He is not ill-appearing, toxic-appearing or diaphoretic.  HENT:     Head: Normocephalic and atraumatic.     Jaw: No tenderness, swelling or pain on movement.     Salivary Glands: Right salivary gland is not diffusely enlarged or tender. Left salivary gland is not diffusely enlarged or tender.     Comments: Negative chvostek sign    Right Ear: Tympanic membrane, ear canal and external ear normal. No tenderness. Tympanic membrane is not erythematous.     Left Ear: Tympanic membrane, ear canal and external ear normal. No tenderness. Tympanic membrane is not erythematous.     Nose: Nose normal. No congestion or rhinorrhea.     Mouth/Throat:     Mouth: Mucous membranes are moist.     Pharynx: No oropharyngeal exudate or posterior oropharyngeal erythema.  Eyes:     General: Lids are normal. Vision grossly intact. Gaze aligned appropriately. No visual field deficit or scleral icterus.       Right eye: No discharge.        Left eye: No discharge.     Extraocular Movements: Extraocular movements intact.     Right eye: Normal extraocular motion.     Left eye: Normal extraocular motion.     Conjunctiva/sclera:     Right eye: Right conjunctiva is not injected.     Left eye: Left conjunctiva is not injected.     Pupils: Pupils are equal, round, and reactive to light.  Cardiovascular:     Rate and Rhythm: Normal rate.  Pulmonary:     Effort: Pulmonary effort is normal.   Musculoskeletal:     Cervical back: Neck supple. No rigidity or tenderness.  Lymphadenopathy:     Cervical: No cervical adenopathy.  Skin:    General: Skin is warm and dry.     Coloration: Skin is not jaundiced or  pale.     Findings: No rash.  Neurological:     Mental Status: He is alert and oriented to person, place, and time. Mental status is at baseline.     Cranial Nerves: Cranial nerves 2-12 are intact. No cranial nerve deficit, dysarthria or facial asymmetry.     Sensory: Sensation is intact.     Motor: Motor function is intact. No weakness, tremor, atrophy or seizure activity.  Psychiatric:        Mood and Affect: Mood normal.        Behavior: Behavior normal.        Thought Content: Thought content normal.        Judgment: Judgment normal.      No results found. No results found. No results found for this or any previous visit (from the past 24 hours).  Assessment/Plan: Philip Spencer is a 33 y.o. male present for OV for  Facial spasm (Primary) We discussed the differential diagnosis that can cause unilateral facial spasms to occur.  Per patient's report, the spasms were persistent over a 4-5-hour.  Yesterday at least 1 spasm per minute.  No other associated symptoms occurred at the time of the spasms. Continue some concern as he seems to have multiple infections and 2 months ago right sided submandibular swelling.  Uncertain if there is an underlying condition contributing. Will recommend patient follow-up with his PCP in 1 week, to ensure current issue resolves and is no underlying etiology present that needs further evaluation. Today we are checking for infection, inflammatory marker, electrolyte disturbances, PTH/vitamin D calcium levels and magnesium levels. Elected to start patient on Valtrex and prednisone taper, in the event the muscle twitching is early signs of Bell's palsy Vistaril 25-50 mg nightly prescribed to help with sleeping pattern while on prednisone. -  Comp Met (CMET) - CBC w/Diff - Sedimentation rate - PTH, Intact and Calcium - Vitamin D (25 hydroxy) - Magnesium Patient will be called with all lab results. Follow-up with PCP in 1 week  Reviewed expectations re: course of current medical issues. Discussed self-management of symptoms. Outlined signs and symptoms indicating need for more acute intervention. Patient verbalized understanding and all questions were answered. Patient received an After-Visit Summary.    Orders Placed This Encounter  Procedures   Comp Met (CMET)   CBC w/Diff   Sedimentation rate   PTH, Intact and Calcium   Vitamin D (25 hydroxy)   Magnesium   Meds ordered this encounter  Medications   predniSONE (DELTASONE) 20 MG tablet    Sig: 60 mg x2d, 40 mg x3d, 20 mg x3d, 10 mg x2d    Dispense:  16 tablet    Refill:  0   valACYclovir (VALTREX) 1000 MG tablet    Sig: Take 1 tablet (1,000 mg total) by mouth 2 (two) times daily for 10 days.    Dispense:  20 tablet    Refill:  0   hydrOXYzine (VISTARIL) 25 MG capsule    Sig: Take 1-2 capsules (25-50 mg total) by mouth at bedtime.    Dispense:  60 capsule    Refill:  0   Referral Orders  No referral(s) requested today     Note is dictated utilizing voice recognition software. Although note has been proof read prior to signing, occasional typographical errors still can be missed. If any questions arise, please do not hesitate to call for verification.   electronically signed by:  Felix Pacini, DO  [facility] [place]

## 2023-05-06 NOTE — Patient Instructions (Signed)
 Return in about 1 week (around 05/13/2023) for w/ PCP.        Great to see you today.  I have refilled the medication(s) we provide.   If labs were collected or images ordered, we will inform you of  results once we have received them and reviewed. We will contact you either by echart message, or telephone call.  Please give ample time to the testing facility, and our office to run,  receive and review results. Please do not call inquiring of results, even if you can see them in your chart. We will contact you as soon as we are able. If it has been over 1 week since the test was completed, and you have not yet heard from [place], then please call us.    - echart message- for normal results that have been seen by the patient already.   - telephone call: abnormal results or if patient has not viewed results in their echart.  If a referral to a specialist was entered for you, please call us in 2 weeks if you have not heard from the specialist office to schedule.

## 2023-05-07 LAB — PTH, INTACT AND CALCIUM
Calcium: 9.6 mg/dL (ref 8.6–10.3)
PTH: 21 pg/mL (ref 16–77)

## 2023-05-09 ENCOUNTER — Other Ambulatory Visit: Payer: Self-pay | Admitting: Family Medicine

## 2023-05-09 ENCOUNTER — Encounter: Payer: Self-pay | Admitting: Family Medicine

## 2023-05-09 MED ORDER — VITAMIN D (ERGOCALCIFEROL) 1.25 MG (50000 UNIT) PO CAPS: 50000.0000 [IU] | ORAL_CAPSULE | ORAL | 0 refills | Status: DC

## 2023-05-11 IMAGING — MR MR CHEST MEDIASTINUM W/O CM
2 series · 16 of 16 positions shown · non-contrast
Comparison: Chest radiograph 09/17/2019

CLINICAL DATA: Pectus excavatum

EXAM:
MR CHEST WITHOUT CONTRAST (LIMITED)
TECHNIQUE: Multiplanar, multisequence MR imaging of the chest was performed. No
intravenous contrast was administered. We performed a limited
examination consisting of 2 axial series as the primary indication
on today's examination was assessment pectus excavatum.

[Series 2: T2 · axial · 5.0mm · 1.19mm/px · z∈[-115,+149]mm · 8 of 45 slices shown]
[im 1/45]
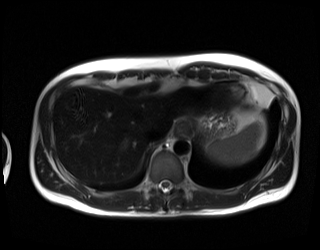
[im 7/45]
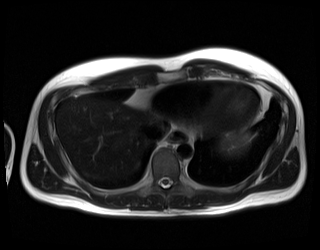
[im 13/45]
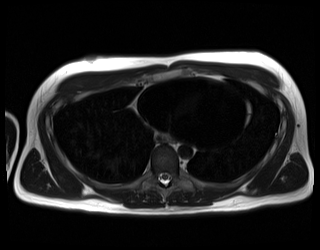
[im 19/45]
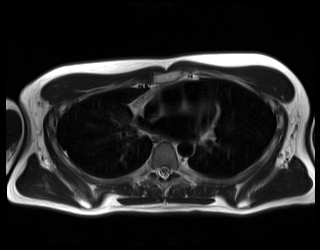
[im 26/45]
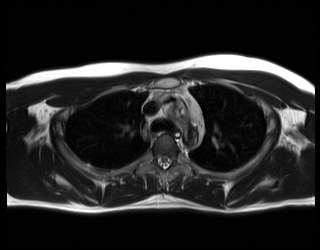
[im 32/45]
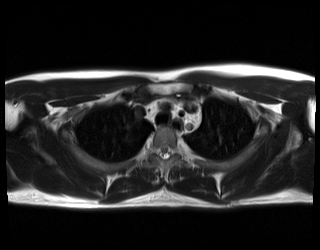
[im 38/45]
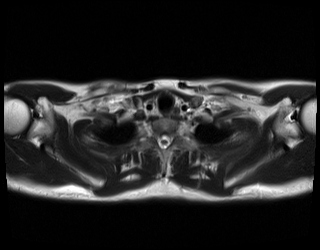
[im 45/45]
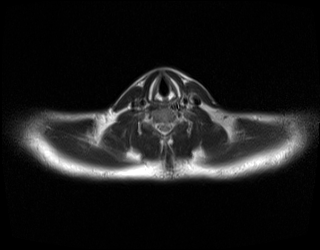

[Series 3: T1 · axial · 5.0mm · 1.19mm/px · z∈[-103,+137]mm · 8 of 41 slices shown]
[im 1/41]
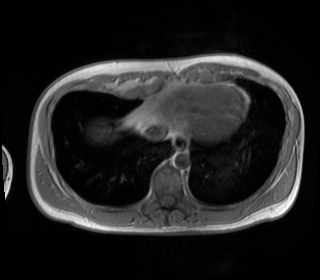
[im 6/41]
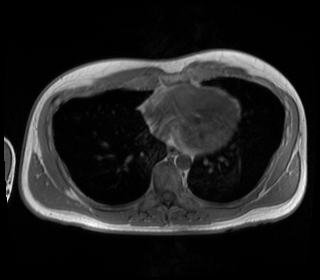
[im 12/41]
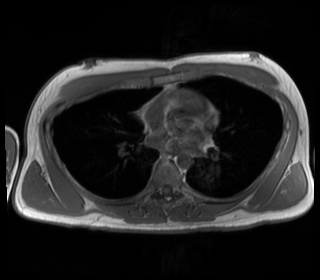
[im 18/41]
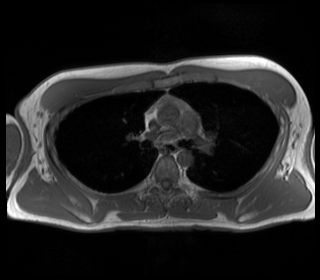
[im 23/41]
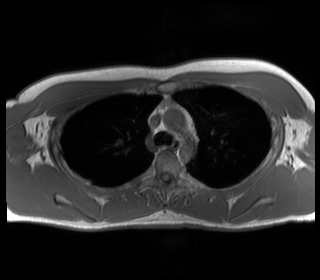
[im 29/41]
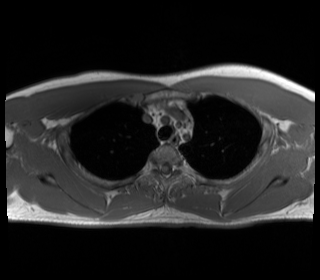
[im 35/41]
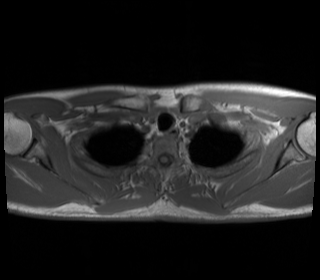
[im 41/41]
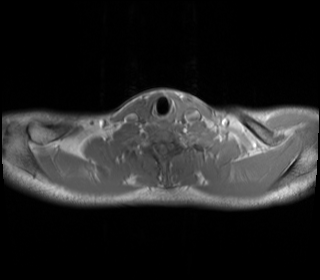

[16 of 16 positions shown; findings below may reference images not displayed]

FINDINGS: Pectus excavatum is confirmed, particularly along the lower sternal
body and xiphoid region with Gab index of 3.2 as measured on
image 40 of series 2. This is mildly higher than the Gab index as
measured on the chest radiograph of 09/17/2019 which was 2.9,
although may be less subject to magnification effects compared with
chest radiography.

There is about 8 degrees of rightward tilt of the anterior face of
the sternum.

No compelling findings of vertebral anomalies. No obvious pulmonary
or mediastinal abnormality on today's limited assessment.
IMPRESSION: 1. Pectus excavatum with Gab index of 3.2 as measured along the
lower sternal body where the pectus is greatest. There is also about
8 degrees of rightward tilt of the anterior face of the sternum.

## 2023-05-12 ENCOUNTER — Ambulatory Visit (INDEPENDENT_AMBULATORY_CARE_PROVIDER_SITE_OTHER)
Admission: RE | Admit: 2023-05-12 | Discharge: 2023-05-12 | Disposition: A | Discharge status: Home / Self Care | Source: Ambulatory Visit | Attending: Family Medicine | Admitting: Family Medicine

## 2023-05-12 ENCOUNTER — Ambulatory Visit (INDEPENDENT_AMBULATORY_CARE_PROVIDER_SITE_OTHER): Admitting: Family Medicine

## 2023-05-12 ENCOUNTER — Encounter: Payer: Self-pay | Admitting: Family Medicine

## 2023-05-12 VITALS — BP 128/80 | HR 70 | Temp 97.9°F | Ht 72.0 in | Wt 171.8 lb

## 2023-05-12 DIAGNOSIS — R6884 Jaw pain: Secondary | ICD-10-CM | POA: Diagnosis not present

## 2023-05-12 DIAGNOSIS — G5139 Clonic hemifacial spasm, unspecified: Secondary | ICD-10-CM

## 2023-05-12 DIAGNOSIS — K068 Other specified disorders of gingiva and edentulous alveolar ridge: Secondary | ICD-10-CM

## 2023-05-12 DIAGNOSIS — G8929 Other chronic pain: Secondary | ICD-10-CM | POA: Diagnosis not present

## 2023-05-12 NOTE — Patient Instructions (Signed)
 Thanks for coming today.  Glad to hear the spasms have resolved.  Continue vitamin D as prescribed by other provider and we can recheck those levels in 6 weeks.  Your exam was overall reassuring but I will refer you to a maxillofacial specialist to talk about the gum lesions, especially with previous surgery needed.  Please have x-ray at the [facility] location below for the jaw but I expect that to be normal.  Follow-up with your dentist as well to discuss these episodic times of jaw pain and gum lesions as they may also be able to advise you further.  Please be seen if any new or worsening symptoms and take care!  [facility] or xray: Walk in 8:30-4:30 during weekdays, no appointment needed [address]  [place], [place] [address]

## 2023-05-12 NOTE — Progress Notes (Signed)
 Subjective:  Patient ID: Philip Spencer, male    DOB: 06/17/1990  Age: 33 y.o. MRN: [id number]  CC:  Chief Complaint  Patient presents with   facial spasm    Pt notes this issue seems to have improved, notes feeling well today, still has a few days left of prednisone     HPI [name] presents for   Facial spasms Evaluated by Dr. Claiborne Billings on 05/06/2023.  Note reviewed.  Right-sided facial spasms, noted previous submandibular swelling, prior infections.  Did have labs with CMP, CBC, sed rate, PTH, vitamin D and magnesium.  Started on Valtrex and prednisone taper to cover for possible early signs of Bell's palsy and Vistaril 25 to 50 mg nightly to help with sleeping while on prednisone.  With the exception of low vitamin D, the rest of his labs were normal below.  Spasms have improved with use of prednisone. Only had 4 hours of spasms prior to OV with Dr. Claiborne Billings. No recurrence.  Overall tolerating prednisone - slight mental side effects.  Lower jaw has been sore at times - past year at times. Not persistent.  Past few years small bumps in gumline in front of the teeth.  Had growth removed by maxillofacial surgeon about 6 years ago.  No recent temp insensitivity of teeth.  No recent dental caries, or dental work.  Has dentist - appt in 3 months.  Swelling under R jaw in December, possible viral illness, lowered immune system with Accutane?   Results for orders placed or performed in visit on 05/06/23  Comp Met (CMET)   Collection Time: 05/06/23  1:50 PM  Result Value Ref Range   Sodium 142 135 - 145 mEq/L   Potassium 4.4 3.5 - 5.1 mEq/L   Chloride 105 96 - 112 mEq/L   CO2 30 19 - 32 mEq/L   Glucose, Bld 98 70 - 99 mg/dL   BUN 12 6 - 23 mg/dL   Creatinine, Ser 4.09 0.40 - 1.50 mg/dL   Total Bilirubin 0.9 0.2 - 1.2 mg/dL   Alkaline Phosphatase 81 39 - 117 U/L   AST 17 0 - 37 U/L   ALT 9 0 - 53 U/L   Total Protein 6.9 6.0 - 8.3 g/dL   Albumin 4.7 3.5 - 5.2 g/dL   GFR 81.19 >14.78  mL/min   Calcium 9.6 8.4 - 10.5 mg/dL  CBC w/Diff   Collection Time: 05/06/23  1:50 PM  Result Value Ref Range   WBC 4.8 4.0 - 10.5 K/uL   RBC 5.06 4.22 - 5.81 Mil/uL   Hemoglobin 15.8 13.0 - 17.0 g/dL   HCT 29.5 62.1 - 30.8 %   MCV 90.6 78.0 - 100.0 fl   MCHC 34.6 30.0 - 36.0 g/dL   RDW 65.7 84.6 - 96.2 %   Platelets 177.0 150.0 - 400.0 K/uL   Neutrophils Relative % 57.0 43.0 - 77.0 %   Lymphocytes Relative 32.7 12.0 - 46.0 %   Monocytes Relative 6.7 3.0 - 12.0 %   Eosinophils Relative 2.8 0.0 - 5.0 %   Basophils Relative 0.8 0.0 - 3.0 %   Neutro Abs 2.7 1.4 - 7.7 K/uL   Lymphs Abs 1.6 0.7 - 4.0 K/uL   Monocytes Absolute 0.3 0.1 - 1.0 K/uL   Eosinophils Absolute 0.1 0.0 - 0.7 K/uL   Basophils Absolute 0.0 0.0 - 0.1 K/uL  Sedimentation rate   Collection Time: 05/06/23  1:50 PM  Result Value Ref Range   Sed  Rate 3 0 - 15 mm/hr  PTH, Intact and Calcium   Collection Time: 05/06/23  1:50 PM  Result Value Ref Range   PTH 21 16 - 77 pg/mL   Calcium 9.6 8.6 - 10.3 mg/dL  Vitamin D (25 hydroxy)   Collection Time: 05/06/23  1:50 PM  Result Value Ref Range   VITD 13.77 (L) 30.00 - 100.00 ng/mL  Magnesium   Collection Time: 05/06/23  3:43 PM  Result Value Ref Range   Magnesium 2.0 1.5 - 2.5 mg/dL   Now on vit D supplement 50k units per week.    History There are no active problems to display for this patient.  Past Medical History:  Diagnosis Date   Substance abuse (HCC)    clean/sober since 08/24/2012   No past surgical history on file. No Known Allergies Prior to Admission medications   Medication Sig Start Date End Date Taking? Authorizing Provider  hydrOXYzine (VISTARIL) 25 MG capsule Take 1-2 capsules (25-50 mg total) by mouth at bedtime. 05/06/23   Kuneff, Renee A, DO  ketoconazole (NIZORAL) 2 % shampoo APPLY 1 APPLICATION TOPICALLY 2 (TWO) TIMES A WEEK. Patient not taking: Reported on 05/06/2023 04/21/23   Shade Flood, MD  predniSONE (DELTASONE) 20 MG tablet  60 mg x2d, 40 mg x3d, 20 mg x3d, 10 mg x2d 05/06/23   Claiborne Billings, Renee A, DO  valACYclovir (VALTREX) 1000 MG tablet Take 1 tablet (1,000 mg total) by mouth 2 (two) times daily for 10 days. 05/06/23 05/16/23  Kuneff, Renee A, DO  Vitamin D, Ergocalciferol, (DRISDOL) 1.25 MG (50000 UNIT) CAPS capsule Take 1 capsule (50,000 Units total) by mouth every 7 (seven) days. 05/09/23   Natalia Leatherwood, DO   Social History   Socioeconomic History   Marital status: Single    Spouse name: Not on file   Number of children: 0   Years of education: Not on file   Highest education level: Some college, no degree  Occupational History   Occupation: Airline pilot +    Comment: [employer]  Tobacco Use   Smoking status: Former    Current packs/day: 0.00    Average packs/day: 0.5 packs/day for 10.0 years (5.0 ttl pk-yrs)    Types: Cigarettes    Start date: 04/12/2009    Quit date: 04/13/2019    Years since quitting: 4.0   Smokeless tobacco: Never  Vaping Use   Vaping status: Former   Substances: Nicotine, Flavoring  Substance and Sexual Activity   Alcohol use: Yes    Comment: occasionally   Drug use: Yes    Types: Benzodiazepines, Marijuana, Other-see comments    Comment: Clean since 08/24/2012 (psychedelics, opiods)   Sexual activity: Not Currently  Other Topics Concern   Not on file  Social History Narrative   Lives with his parents.   Social Drivers of Health   Financial Resource Strain: Medium Risk (05/05/2023)   Overall Financial Resource Strain (CARDIA)    Difficulty of Paying Living Expenses: Somewhat hard  Food Insecurity: No Food Insecurity (05/05/2023)   Hunger Vital Sign    Worried About Running Out of Food in the Last Year: Never true    Ran Out of Food in the Last Year: Never true  Transportation Needs: No Transportation Needs (05/05/2023)   PRAPARE - Administrator, Civil Service (Medical): No    Lack of Transportation (Non-Medical): No  Physical Activity: Sufficiently Active  (05/05/2023)   Exercise Vital Sign    Days of Exercise  per Week: 5 days    Minutes of Exercise per Session: 50 min  Stress: Stress Concern Present (05/05/2023)   [employer] of Occupational Health - Occupational Stress Questionnaire    Feeling of Stress : Rather much  Social Connections: Socially Isolated (05/05/2023)   Social Connection and Isolation Panel [NHANES]    Frequency of Communication with Friends and Family: More than three times a week    Frequency of Social Gatherings with Friends and Family: Twice a week    Attends Religious Services: Never    Database administrator or Organizations: No    Attends Engineer, structural: Not on file    Marital Status: Never married  Intimate Partner Violence: Not on file    Review of Systems Per HPi  Objective:   Vitals:   05/12/23 1305  BP: 128/80  Pulse: 70  Temp: 97.9 F (36.6 C)  TempSrc: Temporal  SpO2: 96%  Weight: 171 lb 12.8 oz (77.9 kg)  Height: 6' (1.829 m)     Physical Exam Vitals reviewed.  Constitutional:      General: He is not in acute distress.    Appearance: Normal appearance. He is well-developed.  HENT:     Head: Normocephalic and atraumatic.     Mouth/Throat:     Mouth: Mucous membranes are moist.     Pharynx: No oropharyngeal exudate or posterior oropharyngeal erythema.     Comments: Lower gumline just below teeth few small approximately 3 to 4 mm lumps left and right side, mobile, nontender.  No discharge. No gum swelling near teeth, no apparent abscess, teeth nontender with percussion.  No appreciable decay.   Jaw nontender, no focal bony tenderness or swelling.  Moving jaw without difficulty.  No appreciable lymphadenopathy of neck or submandibular asymmetry appreciated on exam.  No facial spasms appreciated currently. Cardiovascular:     Rate and Rhythm: Normal rate.  Pulmonary:     Effort: Pulmonary effort is normal.  Neurological:     Mental Status: He is alert and oriented  to person, place, and time.  Psychiatric:        Mood and Affect: Mood normal.        Assessment & Plan:  Dejion R Bisig is a 33 y.o. male . Facial spasm  Gum lesion - Plan: Ambulatory referral to Oral Maxillofacial Surgery, CANCELED: Ambulatory referral to Oral Maxillofacial Surgery  Jaw pain - Plan: DG Mandible 4 Views, Ambulatory referral to Oral Maxillofacial Surgery, CANCELED: Ambulatory referral to Oral Maxillofacial Surgery  Facial spasms have resolved.  RTC precautions if recurrent.  Finished prednisone, meds from last visit.  Small areas of concern within his lower gumline, and mouth.  Does have a tobacco use history, question mucoceles, but very small.  Present for few years.  He would like to have these evaluated further, will refer to maxillofacial surgeon as he has had previous lesion removed approximately 6 years ago, unknown details regarding that surgery.  He does also have intermittent jaw pain, but reassuring exam.  Recommend he discuss with his dentist, will check x-ray, and RTC precautions.  6-week follow-up for recheck vitamin D and follow-up of above.  RTC precautions if worsening sooner.  No orders of the defined types were placed in this encounter.  Patient Instructions  Thanks for coming today.  Glad to hear the spasms have resolved.  Continue vitamin D as prescribed by other provider and we can recheck those levels in 6 weeks.  Your exam was overall  reassuring but I will refer you to a maxillofacial specialist to talk about the gum lesions, especially with previous surgery needed.  Please have x-ray at the [facility] location below for the jaw but I expect that to be normal.  Follow-up with your dentist as well to discuss these episodic times of jaw pain and gum lesions as they may also be able to advise you further.  Please be seen if any new or worsening symptoms and take care!  [facility] or xray: Walk in 8:30-4:30 during weekdays, no appointment  needed [address]  [place], [place] [address]     Signed,   Meredith Staggers, MD [facility], [facility] 05/12/23 1:41 PM

## 2023-05-22 ENCOUNTER — Other Ambulatory Visit: Payer: Self-pay | Admitting: Family Medicine

## 2023-05-22 DIAGNOSIS — R238 Other skin changes: Secondary | ICD-10-CM

## 2023-05-28 ENCOUNTER — Other Ambulatory Visit: Payer: Self-pay | Admitting: Family Medicine

## 2023-06-18 ENCOUNTER — Other Ambulatory Visit: Payer: Self-pay | Admitting: Family Medicine

## 2023-06-23 ENCOUNTER — Ambulatory Visit (INDEPENDENT_AMBULATORY_CARE_PROVIDER_SITE_OTHER): Admitting: Family Medicine

## 2023-06-23 ENCOUNTER — Encounter: Payer: Self-pay | Admitting: Family Medicine

## 2023-06-23 ENCOUNTER — Other Ambulatory Visit (INDEPENDENT_AMBULATORY_CARE_PROVIDER_SITE_OTHER)

## 2023-06-23 VITALS — BP 128/60 | HR 88 | Temp 98.2°F | Ht 72.0 in | Wt 174.8 lb

## 2023-06-23 DIAGNOSIS — E559 Vitamin D deficiency, unspecified: Secondary | ICD-10-CM

## 2023-06-23 DIAGNOSIS — Z114 Encounter for screening for human immunodeficiency virus [HIV]: Secondary | ICD-10-CM | POA: Diagnosis not present

## 2023-06-23 DIAGNOSIS — Z1159 Encounter for screening for other viral diseases: Secondary | ICD-10-CM | POA: Diagnosis not present

## 2023-06-23 DIAGNOSIS — R6884 Jaw pain: Secondary | ICD-10-CM | POA: Diagnosis not present

## 2023-06-23 DIAGNOSIS — L659 Nonscarring hair loss, unspecified: Secondary | ICD-10-CM

## 2023-06-23 LAB — VITAMIN D 25 HYDROXY (VIT D DEFICIENCY, FRACTURES): VITD: 29.5 ng/mL — ABNORMAL LOW (ref 30.00–100.00)

## 2023-06-23 MED ORDER — VITAMIN D (ERGOCALCIFEROL) 1.25 MG (50000 UNIT) PO CAPS: 50000.0000 [IU] | ORAL_CAPSULE | ORAL | 0 refills | Status: DC

## 2023-06-23 NOTE — Progress Notes (Signed)
 Subjective:  Patient ID: Philip Spencer, male    DOB: 08-25-1990  Age: 33 y.o. MRN: [id number]  CC:  Chief Complaint  Patient presents with   Medical Management of Chronic Issues    Pt reports was only dispensed 4 capsules and was supposed to be 12 can Spencer labs Philip Spencer      HPI Philip Spencer presents for   Vitamin D  deficiency Low level of 13.77 on 04/28/2023, ordered prescription strength vitamin D , 50,000 units/week.  Ordered by Philip Spencer.  Had been seen for facial spasm in February, follow-up appointment with me on March 6.  Facial spasms have resolved at that time.  Previously treated with Valtrex  and prednisone  for possible early sign of Bell's.  Separate concern, was referred to maxillofacial surgeon for possible mucoceles, abnormality within lower gumline, mouth.  Intermittent jaw pain discussed at that time as well, mandible x-ray was normal on 05/12/2023.  Appt in mid -May with oral surgeon, and had dental visit since last visit with me - dentist thought area was benign.   Took 4 doses of vitamin D  - only 4 doses were filled. Last taken 3/24.  Still using ketoconazole  shampoo. 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 Has seen dermatology for accutane. Plans repeat visit to discuss hair loss with derm.        History There are no active problems to display for this patient.  Past Medical History:  Diagnosis Date   Substance abuse (HCC)    clean/sober since 08/24/2012   No past surgical history on file. No Known Allergies Prior to Admission medications   Medication Sig Start Date End Date Taking? Authorizing Provider  hydrOXYzine  (VISTARIL ) 25 MG capsule Take 1-2 capsules (25-50 mg total) by mouth at bedtime. 05/06/23   Philip Spencer  ketoconazole  (NIZORAL ) 2 % shampoo APPLY 1 APPLICATION TOPICALLY 2 (TWO) TIMES A WEEK. 05/23/23   Philip Spencer  predniSONE  (DELTASONE ) 20 MG tablet 60 mg x2d, 40 mg x3d, 20 mg x3d, 10 mg x2d 05/06/23   Spencer, Philip A,  Spencer  Vitamin D , Ergocalciferol , (DRISDOL ) 1.25 MG (50000 UNIT) CAPS capsule Take 1 capsule (50,000 Units total) by mouth every 7 (seven) days. 05/09/23   Philip Spencer   Social History   Socioeconomic History   Marital status: Single    Spouse name: Not on file   Number of children: 0   Years of education: Not on file   Highest education level: Some college, no degree  Occupational History   Occupation: Airline pilot +    Comment: [employer]  Tobacco Use   Smoking status: Former    Current packs/day: 0.00    Average packs/day: 0.5 packs/day for 10.0 years (5.0 ttl pk-yrs)    Types: Cigarettes    Start date: 04/12/2009    Quit date: 04/13/2019    Years since quitting: 4.1   Smokeless tobacco: Never  Vaping Use   Vaping status: Former   Substances: Nicotine, Flavoring  Substance and Sexual Activity   Alcohol use: Yes    Comment: occasionally   Drug use: Yes    Types: Benzodiazepines, Marijuana, Other-see comments    Comment: Clean since 08/24/2012 (psychedelics, opiods)   Sexual activity: Not Currently  Other Topics Concern   Not on file  Social History Narrative   Lives with his parents.   Social Drivers of Health   Financial Resource Strain: Medium Risk (05/05/2023)   Overall Physicist, medical Strain (  CARDIA)    Difficulty of Paying Living Expenses: Somewhat hard  Food Insecurity: No Food Insecurity (05/05/2023)   Hunger Vital Sign    Worried About Running Out of Food in the Last Year: Never true    Ran Out of Food in the Last Year: Never true  Transportation Needs: No Transportation Needs (05/05/2023)   PRAPARE - Administrator, Civil Service (Medical): No    Lack of Transportation (Non-Medical): No  Physical Activity: Sufficiently Active (05/05/2023)   Exercise Vital Sign    Days of Exercise per Week: 5 days    Minutes of Exercise per Session: 50 min  Stress: Stress Concern Present (05/05/2023)   [employer] of Occupational Health - Occupational Stress  Questionnaire    Feeling of Stress : Rather much  Social Connections: Socially Isolated (05/05/2023)   Social Connection and Isolation Panel [NHANES]    Frequency of Communication with Friends and Family: More than three times a week    Frequency of Social Gatherings with Friends and Family: Twice a week    Attends Religious Services: Never    Database administrator or Organizations: No    Attends Engineer, structural: Not on file    Marital Status: Never married  Intimate Partner Violence: Not on file    Review of Systems Per HPI.   Objective:   Vitals:   06/23/23 1440  BP: 128/60  Pulse: 88  Temp: 98.2 F (36.8 C)  TempSrc: Temporal  SpO2: 99%  Weight: 174 lb 12.8 oz (79.3 kg)  Height: 6' (1.829 m)     Physical Exam Vitals reviewed.  Constitutional:      Appearance: He is well-developed.  HENT:     Head: Normocephalic and atraumatic.     Comments: No focal jaw tenderness, no lymphadenopathy of neck. Neck:     Vascular: No carotid bruit or JVD.  Cardiovascular:     Rate and Rhythm: Normal rate and regular rhythm.     Heart sounds: Normal heart sounds. No murmur heard. Pulmonary:     Effort: Pulmonary effort is normal.     Breath sounds: Normal breath sounds. No rales.  Musculoskeletal:     Right lower leg: No edema.     Left lower leg: No edema.  Skin:    General: Skin is warm and dry.     Comments: Continue care with some hearing loss and meal pattern distribution frontal.  Scalp otherwise appears normal.  No rash, no flaking.   Neurological:     Mental Status: He is alert and oriented to person, place, and time.  Psychiatric:        Mood and Affect: Mood normal.        Assessment & Plan:  Philip Spencer is a 33 y.o. male . Vitamin D  deficiency - Plan: Vitamin D  (25 hydroxy), Vitamin D , Ergocalciferol , (DRISDOL ) 1.25 MG (50000 UNIT) CAPS capsule  - Additional prescription given for 50,000 units, vitamin D  level closer to normal range, anticipate  this will normalize with weekly dosing as recently off vitamin D .  Recheck next few months.  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  Jaw pain  -  Improved, plans follow-up with specialist as above.  Previous imaging reassuring.  Meds ordered this encounter  Medications   Vitamin D , Ergocalciferol , (DRISDOL ) 1.25 MG (50000 UNIT) CAPS capsule    Sig: Take 1 capsule (50,000 Units total) by mouth every 7 (seven) days.    Dispense:  12 capsule    Refill:  0   Patient Instructions  Sorry about the confusion on the meds, I sent in more vitamin D  Spencer.  Lab Spencer, then recheck in 3 months with repeat labs to decide on otc vs. Rx dose of vitamin D .  Keep follow up with oral surgeon as planned.  Call dermatology for follow up on the hair loss.   [facility] or xray: Walk in 8:30-4:30 during weekdays, no appointment needed [address]  [place], [place] [address]     Signed,   Caro Christmas, [facility], [facility] 06/23/23 3:22 PM

## 2023-06-23 NOTE — Patient Instructions (Addendum)
 Sorry about the confusion on the meds, I sent in more vitamin D today.  Lab today, then recheck in 3 months with repeat labs to decide on otc vs. Rx dose of vitamin D.  Keep follow up with oral surgeon as planned.  Call dermatology for follow up on the hair loss.   [facility] or xray: Walk in 8:30-4:30 during weekdays, no appointment needed [address]  [place], [place] [address]

## 2023-06-25 ENCOUNTER — Encounter: Payer: Self-pay | Admitting: Family Medicine

## 2023-06-25 LAB — HIV ANTIBODY (ROUTINE TESTING W REFLEX): HIV 1&2 Ab, 4th Generation: NONREACTIVE

## 2023-06-25 LAB — HEPATITIS C ANTIBODY: Hepatitis C Ab: NONREACTIVE

## 2023-07-18 ENCOUNTER — Encounter: Payer: Self-pay | Admitting: Family Medicine

## 2023-07-26 ENCOUNTER — Encounter: Payer: Self-pay | Admitting: Family Medicine

## 2023-07-26 NOTE — Telephone Encounter (Signed)
 Patient is asking for your recommendation on a therapist if you have one.

## 2023-08-28 DIAGNOSIS — E162 Hypoglycemia, unspecified: Secondary | ICD-10-CM | POA: Diagnosis not present

## 2023-08-28 DIAGNOSIS — R55 Syncope and collapse: Secondary | ICD-10-CM | POA: Diagnosis not present

## 2023-08-28 DIAGNOSIS — R000 Tachycardia, unspecified: Secondary | ICD-10-CM | POA: Diagnosis not present

## 2023-08-28 DIAGNOSIS — R42 Dizziness and giddiness: Secondary | ICD-10-CM | POA: Diagnosis not present

## 2023-08-28 DIAGNOSIS — R4182 Altered mental status, unspecified: Secondary | ICD-10-CM | POA: Diagnosis not present

## 2023-08-28 DIAGNOSIS — R569 Unspecified convulsions: Secondary | ICD-10-CM | POA: Diagnosis not present

## 2023-08-29 DIAGNOSIS — G40909 Epilepsy, unspecified, not intractable, without status epilepticus: Secondary | ICD-10-CM | POA: Diagnosis not present

## 2023-09-06 ENCOUNTER — Ambulatory Visit
Admission: RE | Admit: 2023-09-06 | Discharge: 2023-09-06 | Disposition: A | Discharge status: Home / Self Care | Source: Ambulatory Visit | Attending: Internal Medicine | Admitting: Internal Medicine

## 2023-09-06 ENCOUNTER — Ambulatory Visit

## 2023-09-06 VITALS — BP 132/96 | HR 104 | Temp 98.9°F | Resp 17

## 2023-09-06 DIAGNOSIS — U071 COVID-19: Secondary | ICD-10-CM | POA: Diagnosis not present

## 2023-09-06 DIAGNOSIS — J069 Acute upper respiratory infection, unspecified: Secondary | ICD-10-CM | POA: Diagnosis not present

## 2023-09-06 DIAGNOSIS — H10023 Other mucopurulent conjunctivitis, bilateral: Secondary | ICD-10-CM | POA: Diagnosis not present

## 2023-09-06 LAB — POC SARS CORONAVIRUS 2 AG -  ED: SARS Coronavirus 2 Ag: POSITIVE — AB

## 2023-09-06 MED ORDER — GUAIFENESIN ER 1200 MG PO TB12: 1200.0000 mg | ORAL_TABLET | Freq: Two times a day (BID) | ORAL | 0 refills | Status: DC

## 2023-09-06 MED ORDER — PROMETHAZINE-DM 6.25-15 MG/5ML PO SYRP: 5.0000 mL | ORAL_SOLUTION | Freq: Every evening | ORAL | 0 refills | Status: DC | PRN

## 2023-09-06 MED ORDER — ERYTHROMYCIN 5 MG/GM OP OINT: TOPICAL_OINTMENT | OPHTHALMIC | 0 refills | Status: DC

## 2023-09-06 NOTE — Discharge Instructions (Signed)
 You have COVID-19 viral illness. Wear a mask for 5 days of symptoms. You may return to public within those 5 days as long as you do not have a fever. Wash hands frequently.  - Take prescribed medicines to help with symptoms: tessalon  perles, promethazine DM (drowsiness precautions with cough syrup, only take at bedtime) - Use over the counter medicines to help with symptoms as discussed: Tylenol, guaifenesin  (mucinex ), zyrtec, etc - Two teaspoons of honey in warm water every 4-6 hours may help with throat pains - Humidifier in your room at night to help add water the air and soothe cough  You have bacterial conjunctivitis (pink eye) which is an eye infection.    - Use antibiotic eye medication sent to pharmacy as directed.  - Change your pillowcase after 2 to 3 days to avoid reinfection.  - You may take Tylenol every 6 hours as needed for any pain you may have.  - Avoid scratching your eye.  Wash your hands frequently to avoid spread of infection to others.  Perform warm compresses to your eye before applying the eye medication.  If you wear contacts, do not use contacts for 14 days. Instead, use your eye glasses for vision correction. Follow-up with eye doctor as needed for new or worsening symptoms.   If you develop any new or worsening symptoms or do not improve in the next 2 to 3 days, please return.  If your symptoms are severe, please go to the emergency room.  Follow-up with your primary care provider for further evaluation and management of your symptoms as well as ongoing wellness visits.  I hope you feel better!

## 2023-09-06 NOTE — ED Triage Notes (Signed)
 Pt c/o cough, congestion, body aches, sweats for 4-5 days. States he was on cruise when symptoms began.  He also develop pink eye in both eyes 2 days ago.

## 2023-09-06 NOTE — ED Provider Notes (Signed)
 [facility]    CSN: [id number] Arrival date & time: 09/06/23  1139      History   Chief Complaint Chief Complaint  Patient presents with   Cough   Conjunctivitis    HPI Philip Spencer is a 33 y.o. male.   Philip Spencer is a 33 y.o. male presenting for chief complaint of Cough and Conjunctivitis. He was recently on a cruise in [place]  where he became sick. Cough, congestion, sore throat, and fatigue started 6 days ago while on the cruise. Cough is sometimes dry and sometimes productive. Sore throat is worsened by swallowing/coughing. Denies chest pain, shortness of breath, palpitations, nausea, vomiting, diarrhea, abdominal pain, dizziness, and known fever. His left eye became red, swollen, and with crusty white/yellow drainage 2 days ago. Symptoms have now spread to the right eye. He does not wear contacts or glasses for vision correction and denies recent trauma/injuries to the eyes. Denies recent sick contacts to his knowledge. Denies history of asthma/copd. He has not attempted use of any OTC medications for symptoms PTA.    Cough Conjunctivitis    Past Medical History:  Diagnosis Date   Substance abuse (HCC)    clean/sober since 08/24/2012    There are no active problems to display for this patient.   History reviewed. No pertinent surgical history.     Home Medications    Prior to Admission medications   Medication Sig Start Date End Date Taking? Authorizing Provider  erythromycin ophthalmic ointment Place a 1/2 inch ribbon of ointment into the lower eyelid every 12 hours for 7 days. 09/06/23  Yes Enedelia Dorna HERO, FNP  Guaifenesin  1200 MG TB12 Take 1 tablet (1,200 mg total) by mouth in the morning and at bedtime. 09/06/23  Yes Enedelia Dorna HERO, FNP  promethazine-dextromethorphan (PROMETHAZINE-DM) 6.25-15 MG/5ML syrup Take 5 mLs by mouth at bedtime as needed for cough. 09/06/23  Yes Enedelia Dorna HERO, FNP  ketoconazole  (NIZORAL ) 2 % shampoo APPLY 1  APPLICATION TOPICALLY 2 (TWO) TIMES A WEEK. 05/23/23   Levora Reyes SAUNDERS, MD  Vitamin D , Ergocalciferol , (DRISDOL ) 1.25 MG (50000 UNIT) CAPS capsule Take 1 capsule (50,000 Units total) by mouth every 7 (seven) days. 06/23/23   Levora Reyes SAUNDERS, MD    Family History Family History  Problem Relation Age of Onset   Hypertension Father    Breast cancer Maternal Aunt    Leukemia Maternal Aunt     Social History Social History   Tobacco Use   Smoking status: Former    Current packs/day: 0.00    Average packs/day: 0.5 packs/day for 10.0 years (5.0 ttl pk-yrs)    Types: Cigarettes    Start date: 04/12/2009    Quit date: 04/13/2019    Years since quitting: 4.4   Smokeless tobacco: Never  Vaping Use   Vaping status: Former   Substances: Nicotine, Flavoring  Substance Use Topics   Alcohol use: Yes    Comment: occasionally   Drug use: Yes    Types: Benzodiazepines, Marijuana, Other-see comments    Comment: Clean since 08/24/2012 (psychedelics, opiods)     Allergies   Patient has no known allergies.   Review of Systems Review of Systems  Respiratory:  Positive for cough.   Per HPI   Physical Exam Triage Vital Signs ED Triage Vitals [09/06/23 1150]  Encounter Vitals Group     BP (!) 132/96     Girls Systolic BP Percentile      Girls Diastolic BP Percentile  Boys Systolic BP Percentile      Boys Diastolic BP Percentile      Pulse Rate (!) 104     Resp 17     Temp 98.9 F (37.2 C)     Temp Source Oral     SpO2 94 %     Weight      Height      Head Circumference      Peak Flow      Pain Score      Pain Loc      Pain Education      Exclude from Growth Chart    No data found.  Updated Vital Signs BP (!) 132/96 (BP Location: Right Arm)   Pulse (!) 104   Temp 98.9 F (37.2 C) (Oral)   Resp 17   SpO2 94%   Visual Acuity Right Eye Distance:   Left Eye Distance:   Bilateral Distance:    Right Eye Near:   Left Eye Near:    Bilateral Near:     Physical  Exam Vitals and nursing note reviewed.  Constitutional:      Appearance: He is not ill-appearing or toxic-appearing.  HENT:     Head: Normocephalic and atraumatic.     Right Ear: Hearing, tympanic membrane, ear canal and external ear normal.     Left Ear: Hearing, tympanic membrane, ear canal and external ear normal.     Nose: Nose normal.     Mouth/Throat:     Lips: Pink.     Mouth: Mucous membranes are moist. No injury or oral lesions.     Dentition: Normal dentition.     Tongue: No lesions.     Pharynx: Oropharynx is clear. Uvula midline. No pharyngeal swelling, oropharyngeal exudate, posterior oropharyngeal erythema, uvula swelling or postnasal drip.     Tonsils: No tonsillar exudate.   Eyes:     General: Lids are normal. Vision grossly intact. Gaze aligned appropriately.        Right eye: Discharge present. No foreign body or hordeolum.        Left eye: Discharge present.No foreign body or hordeolum.     Extraocular Movements: Extraocular movements intact.     Conjunctiva/sclera:     Right eye: Right conjunctiva is injected. No chemosis, exudate or hemorrhage.    Left eye: Left conjunctiva is injected. No chemosis, exudate or hemorrhage.    Pupils: Pupils are equal, round, and reactive to light.     Comments: Bilateral injection of the conjunctivas, purulent crusty drainage bilaterally. EOMs intact.   Neck:     Trachea: Trachea and phonation normal.   Cardiovascular:     Rate and Rhythm: Normal rate and regular rhythm.     Heart sounds: Normal heart sounds, S1 normal and S2 normal.  Pulmonary:     Effort: Pulmonary effort is normal. No respiratory distress.     Breath sounds: Normal breath sounds and air entry. No wheezing, rhonchi or rales.  Chest:     Chest wall: No tenderness.   Musculoskeletal:     Cervical back: Neck supple.  Lymphadenopathy:     Cervical: No cervical adenopathy.   Skin:    General: Skin is warm and dry.     Capillary Refill: Capillary refill  takes less than 2 seconds.     Findings: No rash.   Neurological:     General: No focal deficit present.     Mental Status: He is alert and oriented to person, place,  and time. Mental status is at baseline.     Cranial Nerves: No dysarthria or facial asymmetry.   Psychiatric:        Mood and Affect: Mood normal.        Speech: Speech normal.        Behavior: Behavior normal.        Thought Content: Thought content normal.        Judgment: Judgment normal.      UC Treatments / Results  Labs (all labs ordered are listed, but only abnormal results are displayed) Labs Reviewed  POC SARS CORONAVIRUS 2 AG -  ED - Abnormal; Notable for the following components:      Result Value   SARS Coronavirus 2 Ag Positive (*)    All other components within normal limits    EKG   Radiology No results found.  Procedures Procedures (including critical care time)  Medications Ordered in UC Medications - No data to display  Initial Impression / Assessment and Plan / UC Course  I have reviewed the triage vital signs and the nursing notes.  Pertinent labs & imaging results that were available during my care of the patient were reviewed by me and considered in my medical decision making (see chart for details).   1. COVID-19 virus infection, viral URI with cough COVID testing positive in clinic.  Lungs clear, vitals hemodynamically stable, therefore deferred imaging of the chest.  Patient is not a candidate for antiviral given low risk for severe disease. Discussed most up to date CDC guidelines regarding quarantine and masking to prevent transmission.  Recommend supportive care for symptomatic relief as outlined in AVS.    2. Mucopurulent conjunctivitis of both eyes Presentation consistent with acute bacterial conjunctivitis.  HEENT exam stable, low suspicion for ocular emergency.  Ophthalmic medication as prescribed for the next 7 days.  Warm compress recommended frequently.  Over  the counter medications as needed for aches/pains. Hand hygiene discussed to prevent spread of infection to others.  Advised to change pillowcase after 2 to 3 days of antibiotics to avoid reinfection.   Counseled patient on potential for adverse effects with medications prescribed/recommended today, strict ER and return-to-clinic precautions discussed, patient verbalized understanding.    Final Clinical Impressions(s) / UC Diagnoses   Final diagnoses:  COVID-19 virus infection  Viral URI with cough  Mucopurulent conjunctivitis of both eyes     Discharge Instructions      You have COVID-19 viral illness. Wear a mask for 5 days of symptoms. You may return to public within those 5 days as long as you do not have a fever. Wash hands frequently.  - Take prescribed medicines to help with symptoms: tessalon  perles, promethazine DM (drowsiness precautions with cough syrup, only take at bedtime) - Use over the counter medicines to help with symptoms as discussed: Tylenol, guaifenesin  (mucinex ), zyrtec, etc - Two teaspoons of honey in warm water every 4-6 hours may help with throat pains - Humidifier in your room at night to help add water the air and soothe cough  You have bacterial conjunctivitis (pink eye) which is an eye infection.    - Use antibiotic eye medication sent to pharmacy as directed.  - Change your pillowcase after 2 to 3 days to avoid reinfection.  - You may take Tylenol every 6 hours as needed for any pain you may have.  - Avoid scratching your eye.  Wash your hands frequently to avoid spread of infection to others.  Perform  warm compresses to your eye before applying the eye medication.  If you wear contacts, do not use contacts for 14 days. Instead, use your eye glasses for vision correction. Follow-up with eye doctor as needed for new or worsening symptoms.   If you develop any new or worsening symptoms or do not improve in the next 2 to 3 days, please return.  If  your symptoms are severe, please go to the emergency room.  Follow-up with your primary care provider for further evaluation and management of your symptoms as well as ongoing wellness visits.  I hope you feel better!      ED Prescriptions     Medication Sig Dispense Auth. Provider   erythromycin ophthalmic ointment Place a 1/2 inch ribbon of ointment into the lower eyelid every 12 hours for 7 days. 3.5 g Enedelia Going M, FNP   promethazine-dextromethorphan (PROMETHAZINE-DM) 6.25-15 MG/5ML syrup Take 5 mLs by mouth at bedtime as needed for cough. 118 mL Enedelia Going M, FNP   Guaifenesin  1200 MG TB12 Take 1 tablet (1,200 mg total) by mouth in the morning and at bedtime. 14 tablet Enedelia Going HERO, FNP      PDMP not reviewed this encounter.   Enedelia Going HERO, [place] 09/06/23 1225

## 2023-09-11 ENCOUNTER — Other Ambulatory Visit: Payer: Self-pay | Admitting: Family Medicine

## 2023-09-11 DIAGNOSIS — E559 Vitamin D deficiency, unspecified: Secondary | ICD-10-CM

## 2023-09-21 ENCOUNTER — Other Ambulatory Visit: Payer: Self-pay | Admitting: Family Medicine

## 2023-09-21 DIAGNOSIS — E559 Vitamin D deficiency, unspecified: Secondary | ICD-10-CM

## 2023-09-22 ENCOUNTER — Ambulatory Visit (INDEPENDENT_AMBULATORY_CARE_PROVIDER_SITE_OTHER): Admitting: Family Medicine

## 2023-09-22 ENCOUNTER — Encounter: Payer: Self-pay | Admitting: Family Medicine

## 2023-09-22 VITALS — BP 118/72 | HR 84 | Temp 98.0°F | Wt 185.4 lb

## 2023-09-22 DIAGNOSIS — B070 Plantar wart: Secondary | ICD-10-CM | POA: Insufficient documentation

## 2023-09-22 DIAGNOSIS — L659 Nonscarring hair loss, unspecified: Secondary | ICD-10-CM | POA: Diagnosis not present

## 2023-09-22 DIAGNOSIS — E559 Vitamin D deficiency, unspecified: Secondary | ICD-10-CM | POA: Diagnosis not present

## 2023-09-22 DIAGNOSIS — R55 Syncope and collapse: Secondary | ICD-10-CM

## 2023-09-22 DIAGNOSIS — F411 Generalized anxiety disorder: Secondary | ICD-10-CM | POA: Insufficient documentation

## 2023-09-22 DIAGNOSIS — F1193 Opioid use, unspecified with withdrawal: Secondary | ICD-10-CM | POA: Insufficient documentation

## 2023-09-22 MED ORDER — VITAMIN D (ERGOCALCIFEROL) 1.25 MG (50000 UNIT) PO CAPS: 50000.0000 [IU] | ORAL_CAPSULE | ORAL | 1 refills | Status: DC

## 2023-09-22 NOTE — Progress Notes (Signed)
 Subjective:  Patient ID: Philip Spencer, male    DOB: Nov 17, 1990  Age: 33 y.o. MRN: [id number]  CC:  Chief Complaint  Patient presents with   Medical Management of Chronic Issues    Pt is here today for med mgnt Pt was seen on 08/28/2023 at ER for Syncope episode    HPI Philip Spencer presents for  Follow-up and ER follow-up.  Vitamin D  deficiency Treated with 50,000 units weekly, last reading of 29.5 in April.  Borderline at that time but had been off medication. Rx ran out - still taking weekly. 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  Was continued on ketoconazole  shampoo.  On minoxidil for hair loss.   Syncopal episode Evaluated through ER in [place] . 08/28/23.  Syncopal event versus seizure-like activity with some residual confusion.  Per ER note, had [place], collapsed and was witnessed to have brief spasm of upper extremities lasting less than 30 seconds.  No significant head trauma, no abnormal eye deviation tongue biting or incontinence. Had been sleep deprived, had not eaten anything for at least 6 hours and blood sugars was in the 60s, improved after oral glucose.  He was given IV fluid bolus for suspected dehydration.  CBC without significant leukocytosis anemia or thrombocytopenia.  Bicarb of 17 with AG of 18 otherwise normal BMP, normal LFTs, UA negative for infection or hematuria and EKG was reassuring.   No recurrence. Was told had seizure at age 71. Testing normal at that time, no treatment with meds. No recurrence. No near syncope, palpitations or chest pain.   Covid infection started on Cruise around 6/25 - UC eval 7/1 - symptomatic treatment with otc meds, e-mycin  ointment for pink eye. Symptoms resolved.   No regular lightheadedness. Able to rock climb  recently without difficulty.   History Patient Active Problem List   Diagnosis Date Noted   Anxiety state 09/22/2023   Narcotic withdrawal (HCC) 09/22/2023   Plantar wart of right foot 09/22/2023   Past Medical History:  Diagnosis Date   Substance abuse (HCC)    clean/sober since 08/24/2012   History reviewed. No pertinent surgical history. No Known Allergies Prior to Admission medications   Medication Sig Start Date End Date Taking? Authorizing Provider  erythromycin  ophthalmic ointment Place a 1/2 inch ribbon of ointment into the lower eyelid every 12 hours for 7 days. 09/06/23  Yes Enedelia Dorna HERO, FNP  Guaifenesin  1200 MG TB12 Take 1 tablet (1,200 mg total) by mouth in the morning and at bedtime. 09/06/23  Yes Enedelia Dorna HERO, FNP  ketoconazole  (NIZORAL ) 2 % shampoo APPLY 1 APPLICATION TOPICALLY 2 (TWO) TIMES A WEEK. 05/23/23  Yes Levora Reyes JONELLE, MD  promethazine -dextromethorphan (PROMETHAZINE -DM) 6.25-15 MG/5ML syrup Take 5 mLs by mouth at bedtime as needed for cough. Patient not taking: Reported on 09/22/2023 09/06/23   Enedelia Dorna HERO, FNP  Vitamin D , Ergocalciferol , (DRISDOL ) 1.25 MG (50000 UNIT) CAPS capsule Take 1 capsule (50,000 Units total) by mouth every 7 (seven) days. Patient not taking: Reported on 09/22/2023 06/23/23   Levora Reyes JONELLE, MD   Social History   Socioeconomic History   Marital status: Single    Spouse name: Not on file   Number of children: 0   Years of education: Not on file  Highest education level: Some college, no degree  Occupational History   Occupation: Airline pilot +    Comment: [employer] store  Tobacco Use   Smoking status: Former    Current packs/day: 0.00    Average packs/day: 0.5 packs/day for 10.0 years (5.0 ttl pk-yrs)    Types: Cigarettes    Start date: 04/12/2009    Quit date: 04/13/2019    Years since quitting: 4.4   Smokeless tobacco: Never  Vaping Use   Vaping status: Former   Substances: Nicotine, Flavoring  Substance  and Sexual Activity   Alcohol use: Yes    Comment: occasionally   Drug use: Yes    Types: Benzodiazepines, Marijuana, Other-see comments    Comment: Clean since 08/24/2012 (psychedelics, opiods)   Sexual activity: Not Currently  Other Topics Concern   Not on file  Social History Narrative   Lives with his parents.   Social Drivers of Health   Financial Resource Strain: Medium Risk (05/05/2023)   Overall Financial Resource Strain (CARDIA)    Difficulty of Paying Living Expenses: Somewhat hard  Food Insecurity: No Food Insecurity (05/05/2023)   Hunger Vital Sign    Worried About Running Out of Food in the Last Year: Never true    Ran Out of Food in the Last Year: Never true  Transportation Needs: No Transportation Needs (05/05/2023)   PRAPARE - Administrator, Civil Service (Medical): No    Lack of Transportation (Non-Medical): No  Physical Activity: Sufficiently Active (05/05/2023)   Exercise Vital Sign    Days of Exercise per Week: 5 days    Minutes of Exercise per Session: 50 min  Stress: Stress Concern Present (05/05/2023)   [employer] of Occupational Health - Occupational Stress Questionnaire    Feeling of Stress : Rather much  Social Connections: Socially Isolated (05/05/2023)   Social Connection and Isolation Panel    Frequency of Communication with Friends and Family: More than three times a week    Frequency of Social Gatherings with Friends and Family: Twice a week    Attends Religious Services: Never    Database administrator or Organizations: No    Attends Engineer, structural: Not on file    Marital Status: Never married  Intimate Partner Violence: Not on file    Review of Systems Per HPI.  Objective:   Vitals:   09/22/23 1306  BP: 118/72  Pulse: 84  Temp: 98 F (36.7 C)  SpO2: 98%  Weight: 185 lb 6 oz (84.1 kg)     Physical Exam Vitals reviewed.  Constitutional:      Appearance: He is well-developed.  HENT:     Head:  Normocephalic and atraumatic.  Neck:     Vascular: No carotid bruit or JVD.  Cardiovascular:     Rate and Rhythm: Normal rate and regular rhythm.     Heart sounds: Normal heart sounds. No murmur heard. Pulmonary:     Effort: Pulmonary effort is normal.     Breath sounds: Normal breath sounds. No rales.  Musculoskeletal:     Right lower leg: No edema.     Left lower leg: No edema.  Skin:    General: Skin is warm and dry.  Neurological:     General: No focal deficit present.     Mental Status: He is alert and oriented to person, place, and time. Mental status is at baseline.     Gait: Gait normal.  Psychiatric:  Mood and Affect: Mood normal.        Assessment & Plan:  Philip Spencer is a 33 y.o. male . Syncope and collapse - Plan: Basic metabolic panel with GFR, Ambulatory referral to Neurology  - Syncopal event with some witnessed shaking of the extremities but no other typical stigmata of seizure.  Did have some sedation afterwards but blood sugar was slightly low, may have been related to food, fluid intake, possible syncopal event versus seizure and low suspicion for seizure disorder when evaluated through the ER.  Does report a similar episode when he was age 64 with a possible single isolated seizure at that time but apparently was not started on any antiepileptics, and no recurrence of symptoms.  Still appears to be more syncopal event but will refer him to neurology to decide on further testing, EEG, etc.  RTC/ER precautions in the meantime.   Vitamin D  deficiency - Plan: Vitamin D  (25 hydroxy), Vitamin D , Ergocalciferol , (DRISDOL ) 1.25 MG (50000 UNIT) CAPS capsule  - Continue vitamin D  supplement, check labs and adjust plan accordingly.  Hair loss  - Minoxidil, continues follow-up with dermatology, ketoconazole  shampoo.  No changes.    41-month follow-up for physical.    Meds ordered this encounter  Medications   Vitamin D , Ergocalciferol , (DRISDOL ) 1.25 MG (50000  UNIT) CAPS capsule    Sig: Take 1 capsule (50,000 Units total) by mouth every 7 (seven) days.    Dispense:  12 capsule    Refill:  1   Patient Instructions  I will refer you to neurology to discuss the events that happened in June.  Glad to hear that has not recurred and that you are feeling well.  I am checking a few lab tests today including repeating some of the electrolytes that were borderline at the ER visit and your vitamin D  level.  Continue vitamin D  supplement same dose for now and follow-up with other specialist as planned.  Take care.     Signed,   Reyes Pines, MD [facility], [facility] 09/22/23 2:01 PM

## 2023-09-22 NOTE — Patient Instructions (Addendum)
 I will refer you to neurology to discuss the events that happened in June.  Glad to hear that has not recurred and that you are feeling well.  I am checking a few lab tests today including repeating some of the electrolytes that were borderline at the ER visit and your vitamin D  level.  Continue vitamin D  supplement same dose for now and follow-up with other specialist as planned.  Take care.   Return to the clinic or go to the nearest emergency room if any of your symptoms worsen or new symptoms occur.

## 2023-09-23 LAB — BASIC METABOLIC PANEL WITH GFR
BUN: 13 mg/dL (ref 6–23)
CO2: 27 meq/L (ref 19–32)
Calcium: 8.8 mg/dL (ref 8.4–10.5)
Chloride: 106 meq/L (ref 96–112)
Creatinine, Ser: 0.72 mg/dL (ref 0.40–1.50)
GFR: 121.33 mL/min (ref >60.00)
Glucose, Bld: 65 mg/dL — ABNORMAL LOW (ref 70–99)
Potassium: 4.6 meq/L (ref 3.5–5.1)
Sodium: 140 meq/L (ref 135–145)

## 2023-09-23 LAB — VITAMIN D 25 HYDROXY (VIT D DEFICIENCY, FRACTURES): VITD: 37.32 ng/mL (ref 30.00–100.00)

## 2023-09-24 ENCOUNTER — Ambulatory Visit: Payer: Self-pay | Admitting: Family Medicine

## 2023-09-27 ENCOUNTER — Ambulatory Visit (INDEPENDENT_AMBULATORY_CARE_PROVIDER_SITE_OTHER): Payer: Self-pay | Admitting: Neurology

## 2023-09-27 ENCOUNTER — Encounter: Payer: Self-pay | Admitting: Neurology

## 2023-09-27 DIAGNOSIS — R569 Unspecified convulsions: Secondary | ICD-10-CM

## 2023-09-27 NOTE — Patient Instructions (Signed)
 Routine EEG, I will contact you to go over the results.  If normal, we will proceed with a 3 day-ambulatory EEG Continue to follow up with PCP  Discussed driving restriction for a total of 6 months  Return sooner if worse

## 2023-09-27 NOTE — Progress Notes (Signed)
 [facility]  PATIENT: Philip Spencer DOB: 10-19-1990  REQUESTING CLINICIAN: Levora Reyes JONELLE, MD HISTORY FROM: Patient  REASON FOR VISIT: sSeizure    HISTORICAL  CHIEF COMPLAINT:  Chief Complaint  Patient presents with   New Patient (Initial Visit)    RM13, ALONE, Internal referral for seizure vs syncope: PT STATED that after a flight he felt like he was going to faint and that apparently he had a seizure. Last sz was June 23rd     HISTORY OF PRESENT ILLNESS:  This is a 33 year old gentleman with no reported past medical history who is presenting after a seizure on June 22.  Patient reports traveling to [place]  for a trip, when he was in the baggage claim, he noted that he was not feeling well, feeling like he was going to pass out.  He sat on the floor and the next thing he remember is waking up in the ambulance.  He was told by sister that he started convulsing on the floor, foaming at the mouth, turning blue.  No injury, no tongue biting or urinary incontinence.  He was taken to a local hospital, observed and discharged home.  He was able to continue with the cruise but unfortunately did have COVID during the trip. He tells me he 15 years ago he had a similar event, and both events were associated with sleep deprivation.  And he described it as generalized convulsion.  Patient tells me for his last seizure, he was traveling from [place]  all the way to [place], did not have a diredt flight and he was tired and sleep deprived and was not eating well.  He denies any seizure risk factors, denies any family history of seizures, no history of head injury.   Handedness: Right handed   Onset: June 22  Seizure Type: Loss of consciousness followed by generalized shaking   Current frequency: June 22, previously 15 years ago   Any injuries from seizures: None   Seizure risk factors: None reported   Previous ASMs: None   Currenty ASMs: None   ASMs  side effects: N/A  Brain Images: Not available for review   Previous EEGs: Not available for review    OTHER MEDICAL CONDITIONS: None reported   REVIEW OF SYSTEMS: Full 14 system review of systems performed and negative with exception of: As noted the HPI   ALLERGIES: No Known Allergies  HOME MEDICATIONS: Outpatient Medications Prior to Visit  Medication Sig Dispense Refill   ketoconazole  (NIZORAL ) 2 % shampoo APPLY 1 APPLICATION TOPICALLY 2 (TWO) TIMES A WEEK. 120 mL 0   minoxidil (LONITEN) 2.5 MG tablet Take 2.5 mg by mouth daily.     Vitamin D , Ergocalciferol , (DRISDOL ) 1.25 MG (50000 UNIT) CAPS capsule Take 1 capsule (50,000 Units total) by mouth every 7 (seven) days. 12 capsule 1   erythromycin  ophthalmic ointment Place a 1/2 inch ribbon of ointment into the lower eyelid every 12 hours for 7 days. 3.5 g 0   Guaifenesin  1200 MG TB12 Take 1 tablet (1,200 mg total) by mouth in the morning and at bedtime. 14 tablet 0   No facility-administered medications prior to visit.    PAST MEDICAL HISTORY: Past Medical History:  Diagnosis Date   Substance abuse (HCC)    clean/sober since 08/24/2012    PAST SURGICAL HISTORY: History reviewed. No pertinent surgical history.  FAMILY HISTORY: Family History  Problem Relation Age of Onset   Hypertension Father    Breast cancer Maternal  Aunt    Leukemia Maternal Aunt     SOCIAL HISTORY: Social History   Socioeconomic History   Marital status: Single    Spouse name: Not on file   Number of children: 0   Years of education: Not on file   Highest education level: Some college, no degree  Occupational History   Occupation: Airline pilot +    Comment: e-v[employer] store  Tobacco Use   Smoking status: Former    Current packs/day: 0.00    Average packs/day: 0.5 packs/day for 10.0 years (5.0 ttl pk-yrs)    Types: Cigarettes    Start date: 04/12/2009    Quit date: 04/13/2019    Years since quitting: 4.4    Passive exposure: Past   Smokeless  tobacco: Never  Vaping Use   Vaping status: Former   Substances: Nicotine, Flavoring  Substance and Sexual Activity   Alcohol use: Yes    Alcohol/week: 1.0 standard drink of alcohol    Types: 1 Standard drinks or equivalent per week    Comment: occasionally   Drug use: Yes    Types: Marijuana, Other-see comments    Comment: Clean since 08/24/2012 (psychedelics, opiods)   Sexual activity: Not Currently    Birth control/protection: Condom  Other Topics Concern   Not on file  Social History Narrative   Lives with his parents.   Social Drivers of Health   Financial Resource Strain: Medium Risk (05/05/2023)   Overall Financial Resource Strain (CARDIA)    Difficulty of Paying Living Expenses: Somewhat hard  Food Insecurity: No Food Insecurity (05/05/2023)   Hunger Vital Sign    Worried About Running Out of Food in the Last Year: Never true    Ran Out of Food in the Last Year: Never true  Transportation Needs: No Transportation Needs (05/05/2023)   PRAPARE - Administrator, Civil Service (Medical): No    Lack of Transportation (Non-Medical): No  Physical Activity: Sufficiently Active (05/05/2023)   Exercise Vital Sign    Days of Exercise per Week: 5 days    Minutes of Exercise per Session: 50 min  Stress: Stress Concern Present (05/05/2023)   [employer] of Occupational Health - Occupational Stress Questionnaire    Feeling of Stress : Rather much  Social Connections: Socially Isolated (05/05/2023)   Social Connection and Isolation Panel    Frequency of Communication with Friends and Family: More than three times a week    Frequency of Social Gatherings with Friends and Family: Twice a week    Attends Religious Services: Never    Database administrator or Organizations: No    Attends Engineer, structural: Not on file    Marital Status: Never married  Intimate Partner Violence: Not on file    PHYSICAL EXAM  GENERAL EXAM/CONSTITUTIONAL: Vitals: There were  no vitals filed for this visit. There is no height or weight on file to calculate BMI. Wt Readings from Last 3 Encounters:  09/22/23 185 lb 6 oz (84.1 kg)  06/23/23 174 lb 12.8 oz (79.3 kg)  05/12/23 171 lb 12.8 oz (77.9 kg)   Patient is in no distress; well developed, nourished and groomed; neck is supple  MUSCULOSKELETAL: Gait, strength, tone, movements noted in Neurologic exam below  NEUROLOGIC: MENTAL STATUS:      No data to display         awake, alert, oriented to person, place and time recent and remote memory intact normal attention and concentration language fluent, comprehension intact,  naming intact fund of knowledge appropriate  CRANIAL NERVE:  2nd, 3rd, 4th, 6th - Visual fields full to confrontation, extraocular muscles intact, no nystagmus 5th - facial sensation symmetric 7th - facial strength symmetric 8th - hearing intact 9th - palate elevates symmetrically, uvula midline 11th - shoulder shrug symmetric 12th - tongue protrusion midline  MOTOR:  normal bulk and tone, full strength in the BUE, BLE  SENSORY:  normal and symmetric to light touch  COORDINATION:  finger-nose-finger, fine finger movements normal  GAIT/STATION:  normal   DIAGNOSTIC DATA (LABS, IMAGING, TESTING) - I reviewed patient records, labs, notes, testing and imaging myself where available.  Lab Results  Component Value Date   WBC 4.8 05/06/2023   HGB 15.8 05/06/2023   HCT 45.8 05/06/2023   MCV 90.6 05/06/2023   PLT 177.0 05/06/2023      Component Value Date/Time   NA 140 09/22/2023 1406   K 4.6 09/22/2023 1406   CL 106 09/22/2023 1406   CO2 27 09/22/2023 1406   GLUCOSE 65 (L) 09/22/2023 1406   BUN 13 09/22/2023 1406   CREATININE 0.72 09/22/2023 1406   CALCIUM 8.8 09/22/2023 1406   PROT 6.9 05/06/2023 1350   ALBUMIN 4.7 05/06/2023 1350   AST 17 05/06/2023 1350   ALT 9 05/06/2023 1350   ALKPHOS 81 05/06/2023 1350   BILITOT 0.9 05/06/2023 1350   GFRNONAA 85 (L)  11/05/2012 2000   GFRAA >90 11/05/2012 2000   No results found for: CHOL, HDL, LDLCALC, LDLDIRECT, TRIG No results found for: HGBA1C No results found for: VITAMINB12 No results found for: TSH    ASSESSMENT AND PLAN  33 y.o. year old male  with no past medical history who is presenting after a seizure on June 22.  This was patient's second seizure, the first 1 occurring 15 years ago.  Both seizures were associated with sleep deprivation, ?provoked seizures.  Plan will be to obtain a routine EEG, if normal, we will obtain ambulatory EEG.  If any abnormality will obtain a MRI brain with and without contrast and discuss starting antiseizure medication.   We also discussed driving restriction for a total of 46-months.  He voiced understanding.  Advised him to continue follow-up with PCP and I will see him sooner if symptoms do get worse.   1. Seizures (HCC)     Patient Instructions  Routine EEG, I will contact you to go over the results.  If normal, we will proceed with a 3 day-ambulatory EEG Continue to follow up with PCP  Discussed driving restriction for a total of 6 months  Return sooner if worse    Per [place]  DMV statutes, patients with seizures are not allowed to drive until they have been seizure-free for six months.  Other recommendations include using caution when using heavy equipment or power tools. Avoid working on ladders or at heights. Take showers instead of baths.  Do not swim alone.  Ensure the water temperature is not too high on the home water heater. Do not go swimming alone. Do not lock yourself in a room alone (i.e. bathroom). When caring for infants or small children, sit down when holding, feeding, or changing them to minimize risk of injury to the child in the event you have a seizure. Maintain good sleep hygiene. Avoid alcohol.  Also recommend adequate sleep, hydration, good diet and minimize stress.   During the Seizure  - First, ensure  adequate ventilation and place patients on the floor on their left  side  Loosen clothing around the neck and ensure the airway is patent. If the patient is clenching the teeth, do not force the mouth open with any object as this can cause severe damage - Remove all items from the surrounding that can be hazardous. The patient may be oblivious to what's happening and may not even know what he or she is doing. If the patient is confused and wandering, either gently guide him/her away and block access to outside areas - Reassure the individual and be comforting - Call 911. In most cases, the seizure ends before EMS arrives. However, there are cases when seizures may last over 3 to 5 minutes. Or the individual may have developed breathing difficulties or severe injuries. If a pregnant patient or a person with diabetes develops a seizure, it is prudent to call an ambulance. - Finally, if the patient does not regain full consciousness, then call EMS. Most patients will remain confused for about 45 to 90 minutes after a seizure, so you must use judgment in calling for help. - Avoid restraints but make sure the patient is in a bed with padded side rails - Place the individual in a lateral position with the neck slightly flexed; this will help the saliva drain from the mouth and prevent the tongue from falling backward - Remove all nearby furniture and other hazards from the area - Provide verbal assurance as the individual is regaining consciousness - Provide the patient with privacy if possible - Call for help and start treatment as ordered by the caregiver   After the Seizure (Postictal Stage)  After a seizure, most patients experience confusion, fatigue, muscle pain and/or a headache. Thus, one should permit the individual to sleep. For the next few days, reassurance is essential. Being calm and helping reorient the person is also of importance.  Most seizures are painless and end spontaneously.  Seizures are not harmful to others but can lead to complications such as stress on the lungs, brain and the heart. Individuals with prior lung problems may develop labored breathing and respiratory distress.    Discussed Patients with epilepsy have a small risk of sudden unexpected death, a condition referred to as sudden unexpected death in epilepsy (SUDEP). SUDEP is defined specifically as the sudden, unexpected, witnessed or unwitnessed, nontraumatic and nondrowning death in patients with epilepsy with or without evidence for a seizure, and excluding documented status epilepticus, in which post mortem examination does not reveal a structural or toxicologic cause for death     Orders Placed This Encounter  Procedures   EEG adult    No orders of the defined types were placed in this encounter.   Return if symptoms worsen or fail to improve.    Pastor Falling, MD 09/27/2023, 4:23 PM  [facility] [address], [address] [place], [place] [address] [phone]

## 2023-09-30 ENCOUNTER — Ambulatory Visit: Admitting: *Deleted

## 2023-09-30 ENCOUNTER — Ambulatory Visit: Payer: Self-pay | Admitting: Neurology

## 2023-09-30 DIAGNOSIS — R569 Unspecified convulsions: Secondary | ICD-10-CM | POA: Diagnosis not present

## 2023-09-30 NOTE — Procedures (Signed)
    History:  33 year old man with seizure   EEG classification: Awake and drowsy  Duration: 25 minutes   Technical aspects: This EEG study was done with scalp electrodes positioned according to the 10-20 International system of electrode placement. Electrical activity was reviewed with band pass filter of 1-70Hz , sensitivity of 7 uV/mm, display speed of 57mm/sec with a 60Hz  notched filter applied as appropriate. EEG data were recorded continuously and digitally stored.   Description of the recording: The background rhythms of this recording consists of a fairly well modulated medium amplitude alpha rhythm of 10 Hz that is reactive to eye opening and closure. Present in the anterior head region is a 15-20 Hz beta activity. Photic stimulation was performed, did not show any abnormalities. Hyperventilation was also performed, did not show any abnormalities. Drowsiness was manifested by background fragmentation. No abnormal epileptiform discharges seen during this recording. There was no focal slowing. There were no electrographic seizure identified.   Abnormality: None   Impression: This is a normal awake and drowsy EEG. No evidence of interictal epileptiform discharges. Normal EEGs, however, do not rule out epilepsy.    Philip Xiong, MD [facility]

## 2023-10-03 ENCOUNTER — Telehealth: Payer: Self-pay

## 2023-10-18 DIAGNOSIS — R569 Unspecified convulsions: Secondary | ICD-10-CM | POA: Diagnosis not present

## 2023-10-19 DIAGNOSIS — R569 Unspecified convulsions: Secondary | ICD-10-CM | POA: Diagnosis not present

## 2023-10-20 DIAGNOSIS — R569 Unspecified convulsions: Secondary | ICD-10-CM | POA: Diagnosis not present

## 2023-10-21 DIAGNOSIS — R569 Unspecified convulsions: Secondary | ICD-10-CM

## 2023-10-27 ENCOUNTER — Encounter: Payer: Self-pay | Admitting: Neurology

## 2023-10-27 ENCOUNTER — Encounter: Payer: Self-pay | Admitting: Family Medicine

## 2023-10-28 ENCOUNTER — Other Ambulatory Visit: Payer: Self-pay

## 2023-10-28 DIAGNOSIS — Q676 Pectus excavatum: Secondary | ICD-10-CM

## 2023-10-28 NOTE — Telephone Encounter (Signed)
 Forms are in providers folder at nurse station to review

## 2023-10-28 NOTE — Telephone Encounter (Signed)
 Noted. Signed orders for CBC, CMP, EKG can be performed at visit and will complete paperwork at that time.  I will hold onto paperwork in my office.  Thanks

## 2023-10-28 NOTE — Telephone Encounter (Signed)
 Patient sent mychart message below for surgical clearance. I have let him know to call the office to get appointment scheduled with you. FYI. I have printed all forms and ordered CMP/CBC as future lab order.

## 2023-11-03 ENCOUNTER — Ambulatory Visit (INDEPENDENT_AMBULATORY_CARE_PROVIDER_SITE_OTHER)
Admission: RE | Admit: 2023-11-03 | Discharge: 2023-11-03 | Disposition: A | Discharge status: Home / Self Care | Source: Ambulatory Visit | Attending: Family Medicine | Admitting: Family Medicine

## 2023-11-03 ENCOUNTER — Ambulatory Visit (INDEPENDENT_AMBULATORY_CARE_PROVIDER_SITE_OTHER): Admitting: Family Medicine

## 2023-11-03 ENCOUNTER — Encounter: Payer: Self-pay | Admitting: Family Medicine

## 2023-11-03 VITALS — BP 136/88 | HR 82 | Temp 98.2°F | Resp 14 | Ht 72.0 in | Wt 184.4 lb

## 2023-11-03 DIAGNOSIS — Q676 Pectus excavatum: Secondary | ICD-10-CM | POA: Diagnosis not present

## 2023-11-03 DIAGNOSIS — Z01818 Encounter for other preprocedural examination: Secondary | ICD-10-CM | POA: Diagnosis not present

## 2023-11-03 DIAGNOSIS — Z87898 Personal history of other specified conditions: Secondary | ICD-10-CM

## 2023-11-03 DIAGNOSIS — R55 Syncope and collapse: Secondary | ICD-10-CM

## 2023-11-03 NOTE — Telephone Encounter (Signed)
 Sent patient a Clinical cytogeneticist message asking if he would like for me to keep paperwork until labs, EKG and xray is complete and then fax. Or if he would like to pick up papers now? We also need fax number for where the papers need to be faxed to as we do not have a fax number listed

## 2023-11-03 NOTE — Progress Notes (Signed)
 Subjective:  Patient ID: Philip Spencer, male    DOB: 04-Dec-1990  Age: 33 y.o. MRN: [id number]  CC:  Chief Complaint  Patient presents with   Pre-op Exam    Needs EKG/blood work starting November 1st. Surgery date is 02/06/24 in [place]. Removing bars from patients chest. Has a seizure back in June/July 2025    HPI Philip Spencer presents for   Preop evaluation History of enough procedure for pectus excavatum, performed by Dr. Renaee in [place]  in January 2023.  Now plan for repeat surgery December 1 for removal of hardware.  Presurgery medical consultation form provided.  Will also need preop labs, EKG closer to time of surgery after 11/1. CXR sooner if possible.    He was evaluated July 17 after a syncopal episode in June.  Syncopal episode versus seizure-like activity with some residual confusion, evaluated in the ER.  Had been sleep deprived, and decreased p.o. intake prior to episode.  Reportedly blood sugars were in the 60s, improved after oral glucose and given IV fluids.  Had been told that he had a seizure at age 85 but reportedly testing was normal at that time and no treatment with meds or reoccurrence, no recurrent seizures throughout childhood or until recent episode.  No other typical stigmata of seizure with recent event but with some sedation afterwards may have been related to food intake, relative hypoglycemia.  Referred to neurology for further evaluation.  He was seen July 22.  Dr. Gregg.  Question provoked seizures, ambulatory EEG ordered.  Driving restriction for 6 months discussed.  No antiepileptic medication at this time. EEG performed on July 25.Normal awake and drowsy EEG without evidence of interictal epileptiform discharges.  Plan for a 3-day ambulatory EEG.    He is on vitamin D  for diabetes deficiency with normal reading of 37 at his July 19 visit.  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  Mildly low  glucose of 65 on BMP July 17.  Has completed 3 day EEG - few weeks ago, has not received results yet.  No seizures since event in June. No syncopal/near-syncopal events, fatigue or symptoms of hypoglycemia.    No other health changes or meds.    History Patient Active Problem List   Diagnosis Date Noted   Anxiety state 09/22/2023   Narcotic withdrawal (HCC) 09/22/2023   Plantar wart of right foot 09/22/2023   Past Medical History:  Diagnosis Date   Substance abuse (HCC)    clean/sober since 08/24/2012   History reviewed. No pertinent surgical history. No Known Allergies Prior to Admission medications   Medication Sig Start Date End Date Taking? Authorizing Provider  ketoconazole  (NIZORAL ) 2 % shampoo APPLY 1 APPLICATION TOPICALLY 2 (TWO) TIMES A WEEK. 05/23/23  Yes Levora Reyes JONELLE, MD  minoxidil (LONITEN) 2.5 MG tablet Take 2.5 mg by mouth daily. 09/20/23  Yes [provider]  Vitamin D , Ergocalciferol , (DRISDOL ) 1.25 MG (50000 UNIT) CAPS capsule Take 1 capsule (50,000 Units total) by mouth every 7 (seven) days. 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  Tobacco Use   Smoking status:  Former    Current packs/day: 0.00    Average packs/day: 0.5 packs/day for 10.0 years (5.0 ttl pk-yrs)    Types: Cigarettes    Start date: 04/12/2009    Quit date: 04/13/2019    Years since quitting: 4.5    Passive exposure: Past   Smokeless tobacco: Never  Vaping Use   Vaping status: Former   Substances: Nicotine, Flavoring  Substance and Sexual Activity   Alcohol use: Yes    Alcohol/week: 1.0 standard drink of alcohol    Types: 1 Standard drinks or equivalent per week    Comment: occasionally   Drug use: Yes     Types: Marijuana, Other-see comments    Comment: Clean since 08/24/2012 (psychedelics, opiods)   Sexual activity: Not Currently    Birth control/protection: Condom  Other Topics Concern   Not on file  Social History Narrative   Lives with his parents.   Social Drivers of Health   Financial Resource Strain: Medium Risk (05/05/2023)   Overall Financial Resource Strain (CARDIA)    Difficulty of Paying Living Expenses: Somewhat hard  Food Insecurity: No Food Insecurity (05/05/2023)   Hunger Vital Sign    Worried About Running Out of Food in the Last Year: Never true    Ran Out of Food in the Last Year: Never true  Transportation Needs: No Transportation Needs (05/05/2023)   PRAPARE - Administrator, Civil Service (Medical): No    Lack of Transportation (Non-Medical): No  Physical Activity: Sufficiently Active (05/05/2023)   Exercise Vital Sign    Days of Exercise per Week: 5 days    Minutes of Exercise per Session: 50 min  Stress: Stress Concern Present (05/05/2023)   [employer] of Occupational Health - Occupational Stress Questionnaire    Feeling of Stress : Rather much  Social Connections: Socially Isolated (05/05/2023)   Social Connection and Isolation Panel    Frequency of Communication with Friends and Family: More than three times a week    Frequency of Social Gatherings with Friends and Family: Twice a week    Attends Religious Services: Never    Database administrator or Organizations: No    Attends Engineer, structural: Not on file    Marital Status: Never married  Intimate Partner Violence: Not on file    Review of Systems Per HPI  Objective:   Vitals:   11/03/23 0947  BP: 136/88  Pulse: 82  Resp: 14  Temp: 98.2 F (36.8 C)  TempSrc: Temporal  SpO2: 98%  Weight: 184 lb 6.4 oz (83.6 kg)  Height: 6' (1.829 m)     Physical Exam Vitals reviewed.  Constitutional:      Appearance: He is well-developed.  HENT:     Head: Normocephalic  and atraumatic.     Right Ear: External ear normal.     Left Ear: External ear normal.  Eyes:     Extraocular Movements: Extraocular movements intact.     Conjunctiva/sclera: Conjunctivae normal.     Pupils: Pupils are equal, round, and reactive to light.  Neck:     Thyroid : No thyromegaly.  Cardiovascular:     Rate and Rhythm: Normal rate and regular rhythm.     Heart sounds: Normal heart sounds.  Pulmonary:     Effort: Pulmonary effort is normal. No respiratory distress.     Breath sounds: Normal breath sounds. No wheezing.     Comments: Well-healed scars on lateral chest wall bilaterally Abdominal:  General: There is no distension.     Palpations: Abdomen is soft.     Tenderness: There is no abdominal tenderness.  Musculoskeletal:        General: No tenderness. Normal range of motion.     Cervical back: Normal range of motion and neck supple.  Lymphadenopathy:     Cervical: No cervical adenopathy.  Skin:    General: Skin is warm and dry.  Neurological:     General: No focal deficit present.     Mental Status: He is alert and oriented to person, place, and time.     Deep Tendon Reflexes: Reflexes are normal and symmetric.     Comments: Nonfocal exam.  No pronator drift.  Psychiatric:        Behavior: Behavior normal.        Assessment & Plan:  Philip Spencer is a 33 y.o. male . Pectus excavatum - Plan: DG Chest 2 View  Preoperative evaluation to rule out surgical contraindication - Plan: DG Chest 2 View  Syncope and collapse  History of seizure  Preop eval for removal of hardware from previous Nuss procedure.  Planned in December.  Will have blood work and EKG closer to the time of surgery at follow-up visit in November.  Chest x-ray ordered.  Paperwork completed for presurgery medical consultation form.  Appears to be at acceptable risk for planned procedure but we did discuss continued workup for possible seizure in June.  Per neuro notes it appears that this was  thought to be a seizure.  Initial EEG reassuring, ambulatory EEG results pending.  Can addend paperwork if needed at his November visit depending on the outcome of his ambulatory monitoring and further recommendations per neurology.  No orders of the defined types were placed in this encounter.  Patient Instructions  Plan on labs, EKG at follow up 1st week of November.  Chest x-ray at the [facility] facility below when you get a chance.  I will keep an eye out for the workup and recommendations from neurology, but that would be the only thing that may need to be updated on your paperwork at next visit.  I do not see any specific restrictions at this time, just will need to make sure that anesthesia, and your surgeon are aware of these changes.  Let me know if there are questions and I will see you in November.   [facility] or xray: Walk in 8:30-4:30 during weekdays, no appointment needed [address]  [place], [place] [address]     Signed,   Reyes Pines, MD [facility], [facility] 11/03/23 10:32 AM

## 2023-11-03 NOTE — Patient Instructions (Addendum)
 Plan on labs, EKG at follow up 1st week of November.  Chest x-ray at the [facility] facility below when you get a chance.  I will keep an eye out for the workup and recommendations from neurology, but that would be the only thing that may need to be updated on your paperwork at next visit.  I do not see any specific restrictions at this time, just will need to make sure that anesthesia, and your surgeon are aware of these changes.  Let me know if there are questions and I will see you in November.   [facility] or xray: Walk in 8:30-4:30 during weekdays, no appointment needed [address]  [place], [place] [address]

## 2023-11-04 ENCOUNTER — Ambulatory Visit: Payer: Self-pay | Admitting: Family Medicine

## 2023-11-08 ENCOUNTER — Encounter (INDEPENDENT_AMBULATORY_CARE_PROVIDER_SITE_OTHER): Payer: Self-pay | Admitting: Neurology

## 2023-11-08 DIAGNOSIS — R569 Unspecified convulsions: Secondary | ICD-10-CM

## 2023-11-08 NOTE — Procedures (Signed)
 Clinical History:  This is a 33 year old gentleman with no reported past medical history who is presenting after a seizure on June 22. Patient reports traveling to [place]  for a trip, when he was in the baggage claim, he noted that he was not feeling well, feeling like he was going to pass out. He sat on the floor and the next thing he remember is waking up in the ambulance. He was told by sister that he started convulsing on the floor, foaming at the mouth, turning blue. No injury, no tongue biting or urinary incontinence.   INTERMITTENT MONITORING with VIDEO TECHNICAL SUMMARY:  This AVEEG was performed using equipment provided by [employer] utilizing Bluetooth ( Trackit ) amplifiers with continuous EEGT attended video collection using encrypted remote transmission via [employer] secured cellular tower network with data rates for each AVEEG performed. This is a Therapist, music AVEEG, obtained, according to the 10-20 international electrode placement system, reformatted digitally into referential and bipolar montages. Data was acquired with a minimum of 21 bipolar connections and sampled at a minimum rate of 250 cycles per second per channel, maximum rate of 450 cycles per second per channel and two channels for EKG. The entire VEEG study was recorded through cable and or radio telemetry for subsequent analysis. Specified epochs of the AVEEG data were identified at the direction of the subject by the depression of a push button by the patient. Each patients event file included data acquired two minutes prior to the push button activation and continuing until two minutes afterwards. AVEEG files were reviewed on [employer] server, Licensed Software provided by [employer] with a digital high frequency filter set at 70 Hz and a low frequency filter set at 1 Hz with a paper speed of 42mm/s resulting in 10 seconds per digital page. This entire AVEEG was reviewed by the EEG  Technologist. Random time samples, random sleep samples, clips, patient initiated push button files with included patient daily diary logs, EEG Technologist pruned data was reviewed and verified for accuracy and validity by the governing reading neurologist in full details. This AEEGV was fully compliant with all requirements for CPT 97500 for setup, patient education, take down and administered by an EEG technologist.   Long-Term EEG with Video was monitored intermittently by a qualified EEG technologist for the entirety of the recording; quality check-ins were performed at a minimum of every two hours, checking and documenting real-time data and video to assure the integrity and quality of the recording (e.g., camera position, electrode integrity and impedance), and identify the need for maintenance. For intermittent monitoring, an EEG Technologist monitored no more than 12 patients concurrently. Diagnostic video was captured at least 80% of the time during the recording.   PATIENT EVENTS:  There were no patient events noted or captured during this recording.   TECHNOLOGIST EVENTS:  No clear epileptiform activity was detected by the reviewing neurodiagnostic technologist for further review.  TIME SAMPLES:  10-minutes of every 2 hours recorded are reviewed as random time samples.   SLEEP SAMPLES:  5-minutes of every 24 hours recorded are reviewed as random sleep samples.   AWAKE:  At maximal level of alertness, the posterior dominant background activity was continuous, reactive, low voltage rhythm of 10 Hz. This was symmetric, well-modulated, and attenuated with eye opening. Diffuse, symmetric, frontocentral beta range activity was present.   SLEEP:  N1 Sleep (Stage 1) was observed and characterized by the disappearance of alpha rhythm and the appearance of  vertex activity.   N2 Sleep (Stage 2) was observed and characterized by vertex waves, K-complexes, and sleep spindles.   N3 (Stage 3)  sleep was observed and characterized by high amplitude Delta activity of 20%.   REM sleep was observed.   EKG:  There were no arrhythmias or abnormalities noted during this recording.   Impression:  Normal EEG: awake and asleep   Clinical Correlation:  This is a normal 3-day ambulatory EEG tracing. No focal abnormalities or epileptiform discharges were seen. There were no electrographic seizures noted. No events were captured during the recording. Please note a normal EEG does not exclude the diagnosis of epilepsy.   Henriette Hesser, MD [facility]

## 2023-11-14 ENCOUNTER — Ambulatory Visit: Payer: Self-pay | Admitting: Neurology

## 2023-11-22 ENCOUNTER — Encounter: Payer: Self-pay | Admitting: Neurology

## 2023-11-23 ENCOUNTER — Telehealth: Payer: Self-pay

## 2023-11-23 ENCOUNTER — Telehealth: Payer: Self-pay | Admitting: Plastic Surgery

## 2023-11-23 NOTE — Telephone Encounter (Signed)
 Patient had a NUSS procedure done due to a sunken chest (Pectus excavatum), done on 03-2021). Done in [place] by Dr. Barry Losasso in [place], [place]. He is looking for a doctor who is willin gto remove the hardware, your name was mentioned as a referral, please advise

## 2023-11-23 NOTE — Telephone Encounter (Signed)
 Letter sent via fax for surgical clearance for NUSS BAR REMOVAL FAXED as requested

## 2023-11-24 ENCOUNTER — Other Ambulatory Visit: Payer: Self-pay | Admitting: Family Medicine

## 2023-11-24 DIAGNOSIS — R238 Other skin changes: Secondary | ICD-10-CM

## 2023-12-07 ENCOUNTER — Ambulatory Visit: Admitting: Plastic Surgery

## 2023-12-07 ENCOUNTER — Encounter: Payer: Self-pay | Admitting: Plastic Surgery

## 2023-12-07 VITALS — BP 154/101 | HR 72 | Ht 71.0 in | Wt 182.2 lb

## 2023-12-07 DIAGNOSIS — Q676 Pectus excavatum: Secondary | ICD-10-CM

## 2023-12-07 NOTE — Progress Notes (Signed)
   Referring Provider Levora Reyes SAUNDERS, MD [id number] [place],  [place] [address]   CC: No chief complaint on file.     Philip Spencer is an 33 y.o. male.  HPI: Philip Spencer is a 33 year old male who underwent a Nuss procedure for pectus excavatum 3 years ago by [facility].  Philip Spencer states that the procedure was complicated by an asymmetry of the chest wall in addition to the sternal abnormality.  He is currently scheduled to have the bars removed however there is a question as to where he will be able to have this done.  He was referred to me for evaluation for possible removal of the bars.  No Known Allergies  Outpatient Encounter Medications as of 12/07/2023  Medication Sig   ketoconazole  (NIZORAL ) 2 % shampoo APPLY 1 APPLICATION TOPICALLY 2 (TWO) TIMES A WEEK.   minoxidil (LONITEN) 2.5 MG tablet Take 2.5 mg by mouth daily.   Vitamin D , Ergocalciferol , (DRISDOL ) 1.25 MG (50000 UNIT) CAPS capsule Take 1 capsule (50,000 Units total) by mouth every 7 (seven) days.   No facility-administered encounter medications on file as of 12/07/2023.     Past Medical History:  Diagnosis Date   Substance abuse (HCC)    clean/sober since 08/24/2012    No past surgical history on file.  Family History  Problem Relation Age of Onset   Hypertension Father    Breast cancer Maternal Aunt    Leukemia Maternal Aunt     Social History   Social History Narrative   Lives with his parents.     Review of Systems General: Denies fevers, chills, weight loss CV: Denies chest pain, shortness of breath, palpitations Chest: Previously treated for pectus excavatum  Physical Exam    12/07/2023    8:34 AM 11/03/2023    9:47 AM 09/22/2023    1:06 PM  Vitals with BMI  Height 5' 11 6' 0   Weight 182 lbs 4 oz 184 lbs 6 oz 185 lbs 6 oz  BMI 25.43 25   Systolic 154 136 881  Diastolic 101 88 72  Pulse 72 82 84    General:  No acute distress,  Alert and oriented, Non-Toxic, Normal speech and  affect Chest: Patient has asymmetry of the nipples.  The pectus excavatum has been successfully treated.  He has well-healed scars on each Hemi chest over the bars.  The left inferior bar is palpable   Assessment/Plan Pectus excavatum, now for Nuss bar removal: Had a long discussion with Philip Spencer regarding removal of the bars.  Removal is typically a very straightforward procedure with a low complication rate, however, the complications that can occur can be life-threatening and require reentry into the chest to control bleeding.  For this reason it is typically recommended that a cardiothoracic surgeon remove the bars.  In his case because the placement was complicated by a chest wall abnormality the safest scenario for removal would be to have Dr. Renaee remove the bars as he knows what modifications were needed when the bars were placed.  If Philip Spencer insurance company is unwilling to allow this then I have strongly recommended that he go to [facility] to have the bars removed.   Follow-up with me as needed.  Philip Spencer 12/07/2023, 9:22 AM

## 2023-12-23 ENCOUNTER — Encounter: Payer: Self-pay | Admitting: Plastic Surgery

## 2024-01-11 ENCOUNTER — Encounter: Payer: Self-pay | Admitting: Family Medicine

## 2024-01-11 ENCOUNTER — Ambulatory Visit: Admitting: Family Medicine

## 2024-01-11 VITALS — BP 134/72 | HR 68 | Temp 98.0°F | Resp 16 | Ht 71.0 in | Wt 176.6 lb

## 2024-01-11 DIAGNOSIS — E559 Vitamin D deficiency, unspecified: Secondary | ICD-10-CM | POA: Diagnosis not present

## 2024-01-11 DIAGNOSIS — Z01818 Encounter for other preprocedural examination: Secondary | ICD-10-CM | POA: Diagnosis not present

## 2024-01-11 DIAGNOSIS — Z23 Encounter for immunization: Secondary | ICD-10-CM

## 2024-01-11 DIAGNOSIS — Q676 Pectus excavatum: Secondary | ICD-10-CM

## 2024-01-11 NOTE — Patient Instructions (Signed)
 Thanks for coming today.  I will check the blood work for the preop evaluation and can addend your previous form without additional information.  I do not see any specific restrictions at this time and you appear to be at acceptable risk for the planned procedure.  I will notate this on the form as long as the labs look okay.  Continue same dose of vitamin D  at this time.  First Gardasil vaccine given today, second 1 can be given in 1 to 2 months and the third will be given in 6 months at our follow-up visit.  Happy to see you sooner if needed and good luck on your surgery!

## 2024-01-11 NOTE — Progress Notes (Signed)
 Subjective:  Patient ID: Philip Spencer, male    DOB: 02/06/1991  Age: 33 y.o. MRN: [id number]  CC:  Chief Complaint  Patient presents with   Medical Management of Chronic Issues    Pt is doing well, no concerns at this time, pt is having surgery 02/06/2024 for Nuss procedure, needs clearance. Dr Wadie Burdock [place]    HPI Philip Spencer presents for   Pectus excavatum Preop eval in August. Will be having surgery on 02/06/2024, removal of hardware from Nuss procedure that was performed in 2023.  Dr. Losasso in [place] .  Plan for preop labs and EKG closer to time of surgery, will be performed today.   Syncopal episode, possible seizure See last visit, evaluate in July after syncopal episode.  Neurology visit with question provoked seizures, ambulatory EEG ordered with 6 months driving restrictions.  Held on antiepileptic meds.  Normal awake and drowsy EEG without evidence of interictal epileptiform discharge discharges, and also completed a 3-day EEG - normal. No follow up recommended.   No seizures.  No fatigue or symptoms of hypoglycemia when last discussed At his August visit. Still feels fine.  Neurology provided clearance letter for procedure: Regarding his operation; NUSS bar removal;  there is a small but acceptable risk of peri-operative seizure and TIA/Stroke.  He is not on antiplatelet or antiseizure medication. No further recommendations at the moment.  Has been eating better, more exercise - running/jogging. On creatine once per week.   Still using ketoconazole  shampoo, on minoxidil from derm for hair loss.   Vitamin D  deficiency Treated with 50,000 unit dosing earlier this year. Still on weekly 50k dose.   Last vitamin D  Lab Results  Component Value Date   VD25OH 37.32 09/22/2023   HM: Agrees to gardasil today - 1st of 3 regimen discussed  Declines flu and covid vaccine.    History Patient Active Problem List   Diagnosis Date Noted   Anxiety state 09/22/2023    Narcotic withdrawal (HCC) 09/22/2023   Plantar wart of right foot 09/22/2023   Past Medical History:  Diagnosis Date   Substance abuse (HCC)    clean/sober since 08/24/2012   History reviewed. No pertinent surgical history. No Known Allergies Prior to Admission medications   Medication Sig Start Date End Date Taking? Authorizing Provider  ketoconazole  (NIZORAL ) 2 % shampoo APPLY 1 APPLICATION TOPICALLY 2 (TWO) TIMES A WEEK. 11/24/23   Philip Philip JONELLE, MD  minoxidil (LONITEN) 2.5 MG tablet Take 2.5 mg by mouth daily. 09/20/23   [provider]  Vitamin D , Ergocalciferol , (DRISDOL ) 1.25 MG (50000 UNIT) CAPS capsule Take 1 capsule (50,000 Units total) by mouth every 7 (seven) days. 09/22/23   Philip Philip JONELLE, MD   Social History   Socioeconomic History   Marital status: Single    Spouse name: Not on file   Number of children: 0   Years of education: Not on file   Highest education level: Some college, no degree  Occupational History   Occupation: sales +    Comment: [employer] store  Tobacco Use   Smoking status: Former    Current packs/day: 0.00    Average packs/day: 0.5 packs/day for 10.0 years (5.0 ttl pk-yrs)    Types: Cigarettes    Start date: 04/12/2009    Quit date: 04/13/2019    Years since quitting: 4.7    Passive exposure: Past   Smokeless tobacco: Never  Vaping Use   Vaping status: Former   Substances: Nicotine,  Flavoring  Substance and Sexual Activity   Alcohol use: Yes    Alcohol/week: 1.0 standard drink of alcohol    Types: 1 Standard drinks or equivalent per week    Comment: occasionally   Drug use: Yes    Types: Marijuana, Other-see comments    Comment: Clean since 08/24/2012 (psychedelics, opiods)   Sexual activity: Not Currently    Birth control/protection: Condom  Other Topics Concern   Not on file  Social History Narrative   Lives with his parents.   Social Drivers of Health   Financial Resource Strain: Medium Risk (05/05/2023)   Overall  Financial Resource Strain (CARDIA)    Difficulty of Paying Living Expenses: Somewhat hard  Food Insecurity: No Food Insecurity (05/05/2023)   Hunger Vital Sign    Worried About Running Out of Food in the Last Year: Never true    Ran Out of Food in the Last Year: Never true  Transportation Needs: No Transportation Needs (05/05/2023)   PRAPARE - Administrator, Civil Service (Medical): No    Lack of Transportation (Non-Medical): No  Physical Activity: Sufficiently Active (05/05/2023)   Exercise Vital Sign    Days of Exercise per Week: 5 days    Minutes of Exercise per Session: 50 min  Stress: Stress Concern Present (05/05/2023)   [employer] of Occupational Health - Occupational Stress Questionnaire    Feeling of Stress : Rather much  Social Connections: Socially Isolated (05/05/2023)   Social Connection and Isolation Panel    Frequency of Communication with Friends and Family: More than three times a week    Frequency of Social Gatherings with Friends and Family: Twice a week    Attends Religious Services: Never    Database Administrator or Organizations: No    Attends Engineer, Structural: Not on file    Marital Status: Never married  Intimate Partner Violence: Not on file    Review of Systems Per HPI.   Objective:   Vitals:   01/11/24 1029  BP: 134/72  Pulse: 68  Resp: 16  Temp: 98 F (36.7 C)  TempSrc: Temporal  SpO2: 98%  Weight: 176 lb 9.6 oz (80.1 kg)  Height: 5' 11 (1.803 m)     Physical Exam Vitals reviewed.  Constitutional:      Appearance: He is well-developed.  HENT:     Head: Normocephalic and atraumatic.  Neck:     Vascular: No carotid bruit or JVD.  Cardiovascular:     Rate and Rhythm: Normal rate and regular rhythm.     Heart sounds: Normal heart sounds. No murmur heard. Pulmonary:     Effort: Pulmonary effort is normal.     Breath sounds: Normal breath sounds. No rales.  Musculoskeletal:     Right lower leg: No edema.      Left lower leg: No edema.  Skin:    General: Skin is warm and dry.  Neurological:     Mental Status: He is alert and oriented to person, place, and time.  Psychiatric:        Mood and Affect: Mood normal.   EKG, sinus rhythm, rate 72.  Variable rate.  Compared to EKG on 10/31/2020, no apparent acute ST or T wave changes.  No significant changes appreciated.  Assessment & Plan:  Bow R Spencer is a 33 y.o. male . Preoperative evaluation to rule out surgical contraindication - Plan: CBC, Comprehensive metabolic panel with GFR Pectus excavatum  - Preop paperwork completed  in August but will addend with additional information from labs today.  Has received a letter from the neurology regarding their guidance and possible prior seizures, syncopal episode but EEG monitoring reassuring and no recurrent seizures.  Appears to be at acceptable medical risk for planned procedure above.  Vitamin D  deficiency - Plan: Vitamin D  (25 hydroxy)  - Check vitamin D  level, continue supplementation.  45-month follow-up.  Need for HPV vaccination - Plan: HPV 9-valent vaccine,Recombinat  - Gardasil No. 1 given.  Repeat in 1 to 2 months and then again in 6 months.  Declines other vaccines as above.  No orders of the defined types were placed in this encounter.  Patient Instructions  Thanks for coming today.  I will check the blood work for the preop evaluation and can addend your previous form without additional information.  I do not see any specific restrictions at this time and you appear to be at acceptable risk for the planned procedure.  I will notate this on the form as long as the labs look okay.  Continue same dose of vitamin D  at this time.  First Gardasil vaccine given today, second 1 can be given in 1 to 2 months and the third will be given in 6 months at our follow-up visit.  Happy to see you sooner if needed and good luck on your surgery!    Signed,   Philip Pines, MD [facility],  [facility] 01/11/24 11:22 AM

## 2024-01-24 ENCOUNTER — Other Ambulatory Visit

## 2024-01-24 ENCOUNTER — Telehealth: Payer: Self-pay | Admitting: Family Medicine

## 2024-01-24 NOTE — Telephone Encounter (Signed)
 Copied from CRM #[id number]. Topic: Medical Record Request - Other >> Jan 23, 2024  3:15 PM Suzen RAMAN wrote:   Reason for CRM: Patient states the results of his EKG needed to be sent his surgeon   Dr. Wadie Brazil  Phone:[phone] Fax:[phone]  >> This was faxed on 01/24/24 at 7:25am

## 2024-01-25 ENCOUNTER — Other Ambulatory Visit

## 2024-01-25 ENCOUNTER — Ambulatory Visit: Payer: Self-pay | Admitting: Family Medicine

## 2024-01-25 LAB — COMPREHENSIVE METABOLIC PANEL WITH GFR
ALT: 11 U/L (ref 0–53)
AST: 20 U/L (ref 0–37)
Albumin: 4.5 g/dL (ref 3.5–5.2)
Alkaline Phosphatase: 83 U/L (ref 39–117)
BUN: 21 mg/dL (ref 6–23)
CO2: 30 meq/L (ref 19–32)
Calcium: 9.1 mg/dL (ref 8.4–10.5)
Chloride: 106 meq/L (ref 96–112)
Creatinine, Ser: 0.94 mg/dL (ref 0.40–1.50)
GFR: 107.4 mL/min (ref >60.00)
Glucose, Bld: 86 mg/dL (ref 70–99)
Potassium: 4.2 meq/L (ref 3.5–5.1)
Sodium: 141 meq/L (ref 135–145)
Total Bilirubin: 0.5 mg/dL (ref 0.2–1.2)
Total Protein: 6.7 g/dL (ref 6.0–8.3)

## 2024-01-25 LAB — CBC
HCT: 43.2 % (ref 39.0–52.0)
Hemoglobin: 14.8 g/dL (ref 13.0–17.0)
MCHC: 34.1 g/dL (ref 30.0–36.0)
MCV: 91.7 fl (ref 78.0–100.0)
Platelets: 165 K/uL (ref 150.0–400.0)
RBC: 4.71 Mil/uL (ref 4.22–5.81)
RDW: 13 % (ref 11.5–15.5)
WBC: 3.8 K/uL — ABNORMAL LOW (ref 4.0–10.5)

## 2024-01-25 LAB — VITAMIN D 25 HYDROXY (VIT D DEFICIENCY, FRACTURES): VITD: 52.71 ng/mL (ref 30.00–100.00)

## 2024-01-26 ENCOUNTER — Telehealth: Payer: Self-pay

## 2024-01-26 NOTE — Telephone Encounter (Signed)
 Printed out labs, EKG and surgical clearance to fax to office. Jacquline helped me fax/email documents since out machine stopped working again.

## 2024-01-26 NOTE — Telephone Encounter (Signed)
 Copied from CRM #[id number]. Topic: Clinical - Medical Advice >> Jan 26, 2024  3:27 PM Rea ORN wrote: Reason for CRM: pt called to advise that he needs his surgery clearance, EKG and lab results sent to Dr. Renaee. Pt stated his surgery is scheduled for 02/06/24 and he was told they haven't received the documents yet. Pt requested everything be faxed to [phone] or emailed to [email or web address]. I advised pt that the clinic does not typically send documents via email to another clinic.  Please call back [phone] to advise once documents have been sent.

## 2024-02-06 DIAGNOSIS — K219 Gastro-esophageal reflux disease without esophagitis: Secondary | ICD-10-CM | POA: Diagnosis not present

## 2024-02-06 DIAGNOSIS — F418 Other specified anxiety disorders: Secondary | ICD-10-CM | POA: Diagnosis not present

## 2024-02-06 DIAGNOSIS — Z87891 Personal history of nicotine dependence: Secondary | ICD-10-CM | POA: Diagnosis not present

## 2024-02-06 DIAGNOSIS — Q676 Pectus excavatum: Secondary | ICD-10-CM | POA: Diagnosis not present

## 2024-02-12 ENCOUNTER — Encounter: Payer: Self-pay | Admitting: Family Medicine

## 2024-02-13 NOTE — Telephone Encounter (Signed)
 Can we replace patients bandages? Unsure the type of bandage should I just have the patient come in for consult?

## 2024-02-16 ENCOUNTER — Other Ambulatory Visit: Payer: Self-pay | Admitting: Family Medicine

## 2024-02-16 DIAGNOSIS — R238 Other skin changes: Secondary | ICD-10-CM

## 2024-02-16 NOTE — Telephone Encounter (Signed)
 Requested Prescriptions   Pending Prescriptions Disp Refills   ketoconazole  (NIZORAL ) 2 % shampoo [Pharmacy Med Name: KETOCONAZOLE  2% SHAMPOO] 120 mL 0    Sig: APPLY 1 APPLICATION TOPICALLY 2 (TWO) TIMES A WEEK.     Date of patient request: 02/16/24 Last office visit: 01/24/2024 Upcoming visit: 03/28/2024 Date of last refill: 11/24/23 Last refill amount: 

## 2024-02-20 NOTE — Telephone Encounter (Signed)
 This would best be handled with an evaluation in person first especially since this is potentially a postsurgical issue.  Since he did not have surgery here locally, it would be difficult for him to see his surgeon for this.  I do not mind seeing him, but again this should probably be seen in person either by myself or someone else in the next few days if I am unavailable.  We certainly could order appropriate imaging once we see that area on exam.  I would not feel comfortable just ordering imaging without evaluation first.  Thanks.

## 2024-02-20 NOTE — Telephone Encounter (Signed)
 Patient is requesting scans for chest as he believes Dr.Losasso may have left some hardware behind on left incision site. Would you like for patient to be seen here in clinic or recommend ED/UC for these concerns?

## 2024-02-22 ENCOUNTER — Ambulatory Visit: Admitting: Family Medicine

## 2024-02-23 ENCOUNTER — Ambulatory Visit: Admitting: Family Medicine

## 2024-02-23 ENCOUNTER — Encounter: Payer: Self-pay | Admitting: Family Medicine

## 2024-02-23 VITALS — BP 124/70 | HR 87 | Temp 98.0°F | Ht 71.0 in | Wt 169.0 lb

## 2024-02-23 DIAGNOSIS — R0789 Other chest pain: Secondary | ICD-10-CM | POA: Diagnosis not present

## 2024-02-23 DIAGNOSIS — Q676 Pectus excavatum: Secondary | ICD-10-CM | POA: Diagnosis not present

## 2024-02-23 DIAGNOSIS — R200 Anesthesia of skin: Secondary | ICD-10-CM

## 2024-02-23 DIAGNOSIS — R29898 Other symptoms and signs involving the musculoskeletal system: Secondary | ICD-10-CM

## 2024-02-23 NOTE — Progress Notes (Signed)
 Subjective:  Patient ID: Philip Spencer, male    DOB: November 02, 1990  Age: 33 y.o. MRN: [id number]  CC:  Chief Complaint  Patient presents with   Follow-up    Pt concerned there was some hardware that was left behind and would like a scan to confirm, pt is also still having numbness in his hand     HPI Philip Spencer presents for concerns above.  History of pectus excavatum.  History of Nuss procedure in January 2023 performed in [place] .  Preop eval initially August 20 and then again November 5 for planned removal of hardware from previous procedure.  He underwent surgery December 1 in [place] .  See MyChart messages.  Initial concern of wound healing, and bandages.  Message sent back to patient on December 8, recommended discussion with his treating surgeon/team and determine if in person evaluation needed.  Swelling went down, applied cold packs. Continued regular bandages, healed ok. Bandages came off around day 8.   Most recently additional message from patient on 02/18/2024 requesting a scan ordered for his chest with concern of possible hardware left behind on the left incision site. He notes that 6 months after initial surgery in 2023, part of hardware for rod that sits outside the rib and below skin came loose/detached? Was told that it would be removed at surgery that he most recently had. Feels like the structure is still there. Has not discussed with surgeon as would like to get checked locally initially. Slight discomfort to sleep on side. Sore to lie on side, pressure on ribcage.  No new cough, dyspnea, no fever. No hemoptysis.    R Thumb numbness.  Noticed  R arm numbness - entire R arm felt numb when woke up from anesthesia on 12/1. Told would get better with some time. Still some persistent weakness of R arm - difficulty with weight lifting - feels weak to flex bicep. No change in appearance of bicep. Trouble with same amount of reps vs left, and flexing beyond 90 degrees.   Numbness in arm has improved, still some numbness in tip of thumb.   Strength to grip is intact, just feels differnet with numbness. No difficulty with fine motor movements - able to write ok.       History Patient Active Problem List   Diagnosis Date Noted   Anxiety state 09/22/2023   Narcotic withdrawal (HCC) 09/22/2023   Plantar wart of right foot 09/22/2023   Past Medical History:  Diagnosis Date   Substance abuse (HCC)    clean/sober since 08/24/2012   No past surgical history on file. Allergies[1] Prior to Admission medications  Medication Sig Start Date End Date Taking? Authorizing Provider  ketoconazole  (NIZORAL ) 2 % shampoo APPLY 1 APPLICATION TOPICALLY 2 (TWO) TIMES A WEEK. 02/16/24   Philip Reyes JONELLE, MD  minoxidil (LONITEN) 2.5 MG tablet Take 2.5 mg by mouth daily. 09/20/23   [provider]  Vitamin D , Ergocalciferol , (DRISDOL ) 1.25 MG (50000 UNIT) CAPS capsule Take 1 capsule (50,000 Units total) by mouth every 7 (seven) days. 09/22/23   Philip Reyes JONELLE, MD   Social History   Socioeconomic History   Marital status: Single    Spouse name: Not on file   Number of children: 0   Years of education: Not on file   Highest education level: Some college, no degree  Occupational History   Occupation: sales +    Comment: [employer] store  Tobacco Use   Smoking status: Former  Current packs/day: 0.00    Average packs/day: 0.5 packs/day for 10.0 years (5.0 ttl pk-yrs)    Types: Cigarettes    Start date: 04/12/2009    Quit date: 04/13/2019    Years since quitting: 4.8    Passive exposure: Past   Smokeless tobacco: Never  Vaping Use   Vaping status: Former   Substances: Nicotine, Flavoring  Substance and Sexual Activity   Alcohol use: Yes    Alcohol/week: 1.0 standard drink of alcohol    Types: 1 Standard drinks or equivalent per week    Comment: occasionally   Drug use: Yes    Types: Marijuana, Other-see comments    Comment: Clean since 08/24/2012  (psychedelics, opiods)   Sexual activity: Not Currently    Birth control/protection: Condom  Other Topics Concern   Not on file  Social History Narrative   Lives with his parents.   Social Drivers of Health   Tobacco Use: Medium Risk (01/11/2024)   Patient History    Smoking Tobacco Use: Former    Smokeless Tobacco Use: Never    Passive Exposure: Past  Physicist, Medical Strain: Medium Risk (05/05/2023)   Overall Financial Resource Strain (CARDIA)    Difficulty of Paying Living Expenses: Somewhat hard  Food Insecurity: No Food Insecurity (05/05/2023)   Hunger Vital Sign    Worried About Running Out of Food in the Last Year: Never true    Ran Out of Food in the Last Year: Never true  Transportation Needs: No Transportation Needs (05/05/2023)   PRAPARE - Administrator, Civil Service (Medical): No    Lack of Transportation (Non-Medical): No  Physical Activity: Sufficiently Active (05/05/2023)   Exercise Vital Sign    Days of Exercise per Week: 5 days    Minutes of Exercise per Session: 50 min  Stress: Stress Concern Present (05/05/2023)   Philip Spencer of Occupational Health - Occupational Stress Questionnaire    Feeling of Stress : Rather much  Social Connections: Socially Isolated (05/05/2023)   Social Connection and Isolation Panel    Frequency of Communication with Friends and Family: More than three times a week    Frequency of Social Gatherings with Friends and Family: Twice a week    Attends Religious Services: Never    Database Administrator or Organizations: No    Attends Engineer, Structural: Not on file    Marital Status: Never married  Intimate Partner Violence: Not on file  Depression (PHQ2-9): Low Risk (09/22/2023)   Depression (PHQ2-9)    PHQ-2 Score: 0  Alcohol Screen: Low Risk (05/05/2023)   Alcohol Screen    Last Alcohol Screening Score (AUDIT): 1  Housing: Low Risk (05/05/2023)   Housing Stability Vital Sign    Unable to Pay for Housing  in the Last Year: No    Number of Times Moved in the Last Year: 0    Homeless in the Last Year: No  Utilities: Not on file  Health Literacy: Not on file    Review of Systems   Objective:   Vitals:   02/23/24 1609  BP: 124/70  Pulse: 87  Temp: 98 F (36.7 C)  TempSrc: Temporal  SpO2: 98%  Weight: 169 lb (76.7 kg)  Height: 5' 11 (1.803 m)     Physical Exam Constitutional:      General: He is not in acute distress.    Appearance: Normal appearance. He is well-developed.  HENT:     Head: Normocephalic and atraumatic.  Cardiovascular:     Rate and Rhythm: Normal rate.  Pulmonary:     Effort: Pulmonary effort is normal.  Chest:    Musculoskeletal:     Comments: Equal grip strength, equal strength with testing at wrist, elbow.  Appears to have equal bicep and tricep strength, including at 90 degrees with resisted flexion.  No defect appreciated or focal area of discomfort at distal biceps.  Slight decrease sensation at tip of thumb but otherwise neurovascular intact distally.  Able to oppose fingers with intact strength, ok sign without difficulty.  Reflexes 1+ at biceps, triceps, brachial radialis, appears equal.  C-spine, pain-free range of motion and does not reproduce any arm, hand symptoms.  Neurological:     Mental Status: He is alert and oriented to person, place, and time.  Psychiatric:        Mood and Affect: Mood normal.        Assessment & Plan:  Philip Spencer is a 33 y.o. male . Chest wall discomfort - Plan: DG Ribs Unilateral W/Chest Left Pectus excavatum - Plan: DG Ribs Unilateral W/Chest Left  - Rounded soft tissue structure in the left chest wall with episodic discomfort.  Concern for possible retained hardware versus scar tissue.  Some discomfort over area, no sign of infection at this time.  Will start with rib series to evaluate for radiopaque foreign body or hardware, and if no apparent findings, may need CT for further evaluation.  Depending on  the results he can then discuss plan with his surgeon.  Right arm weakness Hand numbness  - Noted arm numbness after procedure, with progressive improvement since that time.  Question neuronal impingement from positioning.  I do not appreciate any weakness on exam, does have some residual distal thumb numbness but overall symptoms have been improving.  In regards to the bicep weakness, appears to be more of an issue with repetitive flexion.  Option to meet with neurology, nerve conduction studies, but will continue to monitor for now and he will let me know if not improving in the next week or 2.  No orders of the defined types were placed in this encounter.  Patient Instructions  Rib x-ray initially ordered to evaluate the area on the left side of her chest wall.  If nothing seen on that study, then next step would likely be a CT scan.  I will let you know when I review the results.  Depending on what is found, can discuss next steps with your surgeon.  [facility] or xray: Walk in 8:30-4:30 during weekdays, no appointment needed [address]  [place], [place] [address]   The initial arm numbness, hand numbness was likely due to a transient pinched nerve.  Glad to hear that your symptoms are improving.  I do not appreciate significant weakness on exam at this time.  If you are not continuing to improve in the next week or 2, I would recommend meeting with neurology to determine if nerve testing or other imaging indicated.  Let me know and I am happy to place that order.  If any new or worsening symptoms sooner please be seen.  Take care.     Signed,   Reyes Pines, MD [facility], [facility] 02/23/2024 4:49 PM       [1] No Known Allergies

## 2024-02-23 NOTE — Patient Instructions (Addendum)
 Rib x-ray initially ordered to evaluate the area on the left side of her chest wall.  If nothing seen on that study, then next step would likely be a CT scan.  I will let you know when I review the results.  Depending on what is found, can discuss next steps with your surgeon.  [facility] or xray: Walk in 8:30-4:30 during weekdays, no appointment needed [address]  [place], [place] [address]   The initial arm numbness, hand numbness was likely due to a transient pinched nerve.  Glad to hear that your symptoms are improving.  I do not appreciate significant weakness on exam at this time.  If you are not continuing to improve in the next week or 2, I would recommend meeting with neurology to determine if nerve testing or other imaging indicated.  Let me know and I am happy to place that order.  If any new or worsening symptoms sooner please be seen.  Take care.

## 2024-02-24 ENCOUNTER — Ambulatory Visit
Admission: RE | Admit: 2024-02-24 | Discharge: 2024-02-24 | Disposition: A | Source: Ambulatory Visit | Attending: Family Medicine | Admitting: Family Medicine

## 2024-02-24 DIAGNOSIS — R918 Other nonspecific abnormal finding of lung field: Secondary | ICD-10-CM | POA: Diagnosis not present

## 2024-02-24 DIAGNOSIS — R0789 Other chest pain: Secondary | ICD-10-CM

## 2024-02-24 DIAGNOSIS — Q676 Pectus excavatum: Secondary | ICD-10-CM

## 2024-02-27 ENCOUNTER — Ambulatory Visit: Payer: Self-pay | Admitting: Family Medicine

## 2024-02-27 DIAGNOSIS — R0789 Other chest pain: Secondary | ICD-10-CM

## 2024-02-27 DIAGNOSIS — L989 Disorder of the skin and subcutaneous tissue, unspecified: Secondary | ICD-10-CM

## 2024-02-27 DIAGNOSIS — Q676 Pectus excavatum: Secondary | ICD-10-CM

## 2024-03-07 ENCOUNTER — Ambulatory Visit: Payer: Self-pay

## 2024-03-07 NOTE — Progress Notes (Signed)
 Called patient and left vm to return call. Please relay message from Dr. Levora if he calls back

## 2024-03-07 NOTE — Telephone Encounter (Signed)
 FYI Only or Action Required?: Action required by provider: lab or test result follow-up needed.  Patient was last seen in primary care on 02/23/2024 by Levora Reyes SAUNDERS, MD.  Called Nurse Triage reporting Results.   Triage Disposition: Call PCP When Office is Open  Patient/caregiver understands and will follow disposition?: Yes   Copied from CRM #[id number]. Topic: Clinical - Lab/Test Results >> Mar 07, 2024  2:41 PM Philip Spencer wrote: Reason for CRM: Patient is needing imaging results relayed.  Reason for Disposition  Caller requesting routine or non-urgent lab result  Answer Assessment - Initial Assessment Questions 1. REASON FOR CALL or QUESTION: What is your reason for calling today? or How can I best     Philip Spencer returned call regarding imaging results; Philip Spencer states he was able to see results via my chart. Reviewed Dr. Valorie my chart message with Philip Spencer. Philip Spencer states he knows a BB is still present. Reviewed imaging results that discussed soft tissue vs. Hematoma. Philip Spencer states he knows it is a BB and does not have any further questions then hung up.  Protocols used: PCP Call - No Triage-A-AH

## 2024-03-09 NOTE — Progress Notes (Signed)
Patient responded.

## 2024-03-09 NOTE — Telephone Encounter (Signed)
 A message has already been sent to Dr. Levora

## 2024-03-11 ENCOUNTER — Other Ambulatory Visit: Payer: Self-pay | Admitting: Family Medicine

## 2024-03-11 DIAGNOSIS — E559 Vitamin D deficiency, unspecified: Secondary | ICD-10-CM

## 2024-03-12 NOTE — Telephone Encounter (Signed)
 Vitamin D  stable on 50,000 unit dosing on last testing.  Refilled.

## 2024-03-12 NOTE — Telephone Encounter (Signed)
 Okay to refill? Vitamin D  lab was drawn in November and it was normal

## 2024-03-12 NOTE — Telephone Encounter (Signed)
 Please see message below

## 2024-03-13 ENCOUNTER — Encounter: Payer: Self-pay | Admitting: Family Medicine

## 2024-03-13 NOTE — Addendum Note (Signed)
 Addended by: Tinsleigh Slovacek R on: 03/13/2024 02:14 PM   Modules accepted: Orders

## 2024-03-16 ENCOUNTER — Ambulatory Visit
Admission: RE | Admit: 2024-03-16 | Discharge: 2024-03-16 | Disposition: A | Source: Ambulatory Visit | Attending: Family Medicine | Admitting: Family Medicine

## 2024-03-16 DIAGNOSIS — R0789 Other chest pain: Secondary | ICD-10-CM

## 2024-03-16 DIAGNOSIS — Q676 Pectus excavatum: Secondary | ICD-10-CM

## 2024-03-16 DIAGNOSIS — L989 Disorder of the skin and subcutaneous tissue, unspecified: Secondary | ICD-10-CM

## 2024-03-24 ENCOUNTER — Ambulatory Visit: Payer: Self-pay | Admitting: Family Medicine

## 2024-03-28 ENCOUNTER — Encounter: Payer: Self-pay | Admitting: Family Medicine

## 2024-03-28 ENCOUNTER — Other Ambulatory Visit (HOSPITAL_COMMUNITY)
Admission: RE | Admit: 2024-03-28 | Discharge: 2024-03-28 | Disposition: A | Source: Ambulatory Visit | Attending: Family Medicine | Admitting: Family Medicine

## 2024-03-28 ENCOUNTER — Ambulatory Visit (INDEPENDENT_AMBULATORY_CARE_PROVIDER_SITE_OTHER): Admitting: Family Medicine

## 2024-03-28 VITALS — BP 122/74 | HR 99 | Temp 98.3°F | Resp 12 | Ht 71.0 in | Wt 172.6 lb

## 2024-03-28 DIAGNOSIS — Z0000 Encounter for general adult medical examination without abnormal findings: Secondary | ICD-10-CM | POA: Diagnosis not present

## 2024-03-28 DIAGNOSIS — R0789 Other chest pain: Secondary | ICD-10-CM | POA: Diagnosis not present

## 2024-03-28 DIAGNOSIS — Z13220 Encounter for screening for lipoid disorders: Secondary | ICD-10-CM | POA: Diagnosis not present

## 2024-03-28 DIAGNOSIS — Z113 Encounter for screening for infections with a predominantly sexual mode of transmission: Secondary | ICD-10-CM | POA: Diagnosis not present

## 2024-03-28 DIAGNOSIS — Z23 Encounter for immunization: Secondary | ICD-10-CM

## 2024-03-28 NOTE — Progress Notes (Signed)
 "  Subjective:  Patient ID: Philip Spencer, male    DOB: 1990/03/30  Age: 34 y.o. MRN: [id number]  CC:  Chief Complaint  Patient presents with   Annual Exam    Left side he still feels that something is there. The area has not changed in size. Has not seen CT scan results as of yet    HPI Philip Spencer presents for Annual Exam  Annual physical exam.  Status post Nuss procedure in 03/2021 - noticed the abnormality on side of chest wall in May of 2023. Been there ever since. No changes. See recent notes, imaging after removal of hardware, soft tissue findings on CT with small fluid collection, likely postoperative.  Advised to discuss with his treating surgeon. He has not yet read report - reviewed with him in office today.  Has discussed with surgeon in past, told may be an area that resolve over time? Has not discussed most recent CT results. Plans to discuss with his surgeon first, but option given to meet with surgeon (plastic or chest surgeon?) locally if needed,   Derm: Dr. Kavuri, on minoxidil for hair thinning. Seems to be helping some.   Vitamin D  deficiency: On 50k units weekly.  Last vitamin D  Lab Results  Component Value Date   VD25OH 52.71 01/25/2024        09/22/2023    1:06 PM 07/07/2022    1:32 PM 10/29/2021    1:26 PM 04/16/2021   11:43 AM 09/01/2020    3:50 PM  Depression screen PHQ 2/9  Decreased Interest 0 0 0 0 0  Down, Depressed, Hopeless 0 0 0 0 0  PHQ - 2 Score 0 0 0 0 0  Altered sleeping 0 0   1  Tired, decreased energy 0 0   0  Change in appetite 0 0   0  Feeling bad or failure about yourself  0 0   1  Trouble concentrating 0 0   0  Moving slowly or fidgety/restless 0 0   0  Suicidal thoughts 0 0   0  PHQ-9 Score 0  0    2   Difficult doing work/chores Not difficult at all         Data saved with a previous flowsheet row definition    Health Maintenance  Topic Date Due   HPV VACCINES (2 - 3-dose SCDM series) 02/08/2024   COVID-19 Vaccine (3 - 2025-26  season) 04/13/2024 (Originally 11/07/2023)   Influenza Vaccine  06/05/2024 (Originally 10/07/2023)   DTaP/Tdap/Td (8 - Td or Tdap) 11/22/2029   Hepatitis B Vaccines 19-59 Average Risk  Completed   Hepatitis C Screening  Completed   HIV Screening  Completed   Pneumococcal Vaccine  Aged Out   Meningococcal B Vaccine  Aged Out   FH: maternal GM and her sisters with breast CA, not in mother.  No early CA in family.    Immunization History  Administered Date(s) Administered   DTaP 11/27/1991, 01/28/1992, 03/27/1992, 01/07/1993   HIB (PRP-OMP) 11/27/1991, 01/28/1992, 03/27/1992, 01/07/1993   HPV 9-valent 01/11/2024   Hepatitis B, PED/ADOLESCENT January 08, 1992, 11/27/1991, 03/27/1992   IPV 11/27/1991, 01/28/1992, 06/26/1992   Influenza,inj,Quad PF,6+ Mos 10/29/2021   MMR 01/07/1993, 09/04/1996   PFIZER(Purple Top)SARS-COV-2 Vaccination 05/29/2019, 06/19/2019   Td 10/14/2010, 11/23/2019   Tdap 10/14/2010  Flu vaccine - today.  Covid booster - declines.  HPV vaccine - 2nd dose today.   No results found.  No optho, no corrective lenses.  Dental:Within Last 6 months  Alcohol: none  Tobacco: none  Exercise: 10k step walking per day. Weightlifting 2 times per week - to 1 hr.   STI screening - requests.  No new partners.   No results found for: CHOL, HDL, LDLCALC, LDLDIRECT, TRIG, CHOLHDL   History Patient Active Problem List   Diagnosis Date Noted   Anxiety state 09/22/2023   Narcotic withdrawal (HCC) 09/22/2023   Plantar wart of right foot 09/22/2023   Past Medical History:  Diagnosis Date   Substance abuse (HCC)    clean/sober since 08/24/2012   History reviewed. No pertinent surgical history. Allergies[1] Prior to Admission medications  Medication Sig Start Date End Date Taking? Authorizing Provider  ketoconazole  (NIZORAL ) 2 % shampoo APPLY 1 APPLICATION TOPICALLY 2 (TWO) TIMES A WEEK. 02/16/24  Yes Levora Reyes SAUNDERS, MD  minoxidil (LONITEN) 2.5 MG  tablet Take 2.5 mg by mouth daily. 09/20/23  Yes [provider]  Vitamin D , Ergocalciferol , (DRISDOL ) 1.25 MG (50000 UNIT) CAPS capsule TAKE 1 CAPSULE (50,000 UNITS TOTAL) BY MOUTH EVERY 7 (SEVEN) DAYS 03/12/24  Yes Levora Reyes SAUNDERS, MD   Social History   Socioeconomic History   Marital status: Single    Spouse name: Not on file   Number of children: 0   Years of education: Not on file   Highest education level: Some college, no degree  Occupational History   Occupation: sales +    Comment: [employer] store  Tobacco Use   Smoking status: Former    Current packs/day: 0.00    Average packs/day: 0.5 packs/day for 10.0 years (5.0 ttl pk-yrs)    Types: Cigarettes    Start date: 04/12/2009    Quit date: 04/13/2019    Years since quitting: 4.9    Passive exposure: Past   Smokeless tobacco: Never  Vaping Use   Vaping status: Former   Substances: Nicotine, Flavoring  Substance and Sexual Activity   Alcohol use: Yes    Alcohol/week: 1.0 standard drink of alcohol    Types: 1 Standard drinks or equivalent per week    Comment: occasionally   Drug use: Yes    Types: Marijuana, Other-see comments    Comment: Clean since 08/24/2012 (psychedelics, opiods)   Sexual activity: Not Currently    Birth control/protection: Condom  Other Topics Concern   Not on file  Social History Narrative   Lives with his parents.   Social Drivers of Health   Tobacco Use: Medium Risk (03/28/2024)   Patient History    Smoking Tobacco Use: Former    Smokeless Tobacco Use: Never    Passive Exposure: Past  Physicist, Medical Strain: Medium Risk (05/05/2023)   Overall Financial Resource Strain (CARDIA)    Difficulty of Paying Living Expenses: Somewhat hard  Food Insecurity: No Food Insecurity (05/05/2023)   Hunger Vital Sign    Worried About Running Out of Food in the Last Year: Never true    Ran Out of Food in the Last Year: Never true  Transportation Needs: No Transportation Needs (05/05/2023)   PRAPARE -  Administrator, Civil Service (Medical): No    Lack of Transportation (Non-Medical): No  Physical Activity: Sufficiently Active (05/05/2023)   Exercise Vital Sign    Days of Exercise per Week: 5 days    Minutes of Exercise per Session: 50 min  Stress: Stress Concern Present (05/05/2023)   [employer] of Occupational Health - Occupational Stress Questionnaire    Feeling of Stress : Rather much  Social Connections: Socially Isolated (05/05/2023)   Social Connection and Isolation Panel    Frequency of Communication with Friends and Family: More than three times a week    Frequency of Social Gatherings with Friends and Family: Twice a week    Attends Religious Services: Never    Database Administrator or Organizations: No    Attends Engineer, Structural: Not on file    Marital Status: Never married  Intimate Partner Violence: Not on file  Depression (PHQ2-9): Low Risk (09/22/2023)   Depression (PHQ2-9)    PHQ-2 Score: 0  Alcohol Screen: Low Risk (05/05/2023)   Alcohol Screen    Last Alcohol Screening Score (AUDIT): 1  Housing: Low Risk (05/05/2023)   Housing Stability Vital Sign    Unable to Pay for Housing in the Last Year: No    Number of Times Moved in the Last Year: 0    Homeless in the Last Year: No  Utilities: Not on file  Health Literacy: Not on file    Review of Systems  13 point review of systems per patient health survey noted.  Negative other than as indicated above or in HPI.   Objective:   Vitals:   03/28/24 1516  BP: 122/74  Pulse: 99  Resp: 12  Temp: 98.3 F (36.8 C)  TempSrc: Temporal  SpO2: 98%  Weight: 172 lb 9.6 oz (78.3 kg)  Height: 5' 11 (1.803 m)     Physical Exam Vitals reviewed.  Constitutional:      Appearance: He is well-developed.  HENT:     Head: Normocephalic and atraumatic.     Right Ear: External ear normal.     Left Ear: External ear normal.  Eyes:     Conjunctiva/sclera: Conjunctivae normal.     Pupils:  Pupils are equal, round, and reactive to light.  Neck:     Thyroid : No thyromegaly.  Cardiovascular:     Rate and Rhythm: Normal rate and regular rhythm.     Heart sounds: Normal heart sounds.  Pulmonary:     Effort: Pulmonary effort is normal. No respiratory distress.     Breath sounds: Normal breath sounds. No wheezing.  Chest:    Abdominal:     General: There is no distension.     Palpations: Abdomen is soft.     Tenderness: There is no abdominal tenderness.  Musculoskeletal:        General: No tenderness. Normal range of motion.     Cervical back: Normal range of motion and neck supple.  Lymphadenopathy:     Cervical: No cervical adenopathy.  Skin:    General: Skin is warm and dry.  Neurological:     Mental Status: He is alert and oriented to person, place, and time.     Deep Tendon Reflexes: Reflexes are normal and symmetric.  Psychiatric:        Behavior: Behavior normal.     Assessment & Plan:  Philip Spencer is a 34 y.o. male . Annual physical exam  - -anticipatory guidance as below in AVS, screening labs above. Health maintenance items as above in HPI discussed/recommended as applicable.   Screening examination for STI - Plan: Urine cytology ancillary only, HIV Antibody (routine testing w rflx), RPR W/RFLX TO RPR TITER, TREPONEMAL AB, SCREEN AND DIAGNOSIS, CANCELED: HPV 9-valent vaccine,Recombinat  Chest wall discomfort  - CT results reviewed as above.  He plans to discuss this with his surgeon, but we did discuss the option to see  someone locally, specifically either plastic surgery given location of firm area or cardiothoracic surgeon given prior procedure.  I do think that plastic surgery may be reasonable initially.  He will let me know.  Screening for hyperlipidemia - Plan: Lipid panel, CANCELED: Lipid panel  Need for HPV vaccine - Plan: HPV 9-valent vaccine,Recombinat, CANCELED: HPV 9-valent vaccine,Recombinat  - Second HPV vaccine due, then third in 4 months.   Some confusion regarding order in office, he will return for a nurse visit for that vaccine.  No orders of the defined types were placed in this encounter.  Patient Instructions  Thank you for coming in today. No change in medications at this time. If there are any concerns on your bloodwork, I will let you know.   I think it would be worth discussing the recent imaging findings with your surgeon but I am happy to refer you locally to either a chest surgeon or plastic surgeon to evaluate the area on the side of your chest wall, and decide if any treatment would be recommended.  Just let me know with a MyChart message and I am happy to place that referral.  If you have additional questions let me know.  Take care.    Preventive Care 71-66 Years Spencer, Male Preventive care refers to lifestyle choices and visits with your health care provider that can promote health and wellness. Preventive care visits are also called wellness exams. What can I expect for my preventive care visit? Counseling During your preventive care visit, your health care provider may ask about your: Medical history, including: Past medical problems. Family medical history. Current health, including: Emotional well-being. Home life and relationship well-being. Sexual activity. Lifestyle, including: Alcohol, nicotine or tobacco, and drug use. Access to firearms. Diet, exercise, and sleep habits. Safety issues such as seatbelt and bike helmet use. Sunscreen use. Work and work astronomer. Physical exam Your health care provider may check your: Height and weight. These may be used to calculate your BMI (body mass index). BMI is a measurement that tells if you are at a healthy weight. Waist circumference. This measures the distance around your waistline. This measurement also tells if you are at a healthy weight and may help predict your risk of certain diseases, such as type 2 diabetes and high blood pressure. Heart rate  and blood pressure. Body temperature. Skin for abnormal spots. What immunizations do I need?  Vaccines are usually given at various ages, according to a schedule. Your health care provider will recommend vaccines for you based on your age, medical history, and lifestyle or other factors, such as travel or where you work. What tests do I need? Screening Your health care provider may recommend screening tests for certain conditions. This may include: Lipid and cholesterol levels. Diabetes screening. This is done by checking your blood sugar (glucose) after you have not eaten for a while (fasting). Hepatitis B test. Hepatitis C test. HIV (human immunodeficiency virus) test. STI (sexually transmitted infection) testing, if you are at risk. Talk with your health care provider about your test results, treatment options, and if necessary, the need for more tests. Follow these instructions at home: Eating and drinking  Eat a healthy diet that includes fresh fruits and vegetables, whole grains, lean protein, and low-fat dairy products. Drink enough fluid to keep your urine pale yellow. Take vitamin and mineral supplements as recommended by your health care provider. Do not drink alcohol if your health care provider tells you not to drink. If  you drink alcohol: Limit how much you have to 0-2 drinks a day. Know how much alcohol is in your drink. In the [place] one drink equals one 12 oz bottle of beer (355 mL), one 5 oz glass of wine (148 mL), or one 1 oz glass of hard liquor (44 mL). Lifestyle Brush your teeth every morning and night with fluoride toothpaste. Floss one time each day. Exercise for at least 30 minutes 5 or more days each week. Do not use any products that contain nicotine or tobacco. These products include cigarettes, chewing tobacco, and vaping devices, such as e-cigarettes. If you need help quitting, ask your health care provider. Do not use drugs. If you are sexually active,  practice safe sex. Use a condom or other form of protection to prevent STIs. Find healthy ways to manage stress, such as: Meditation, yoga, or listening to music. Journaling. Talking to a trusted person. Spending time with friends and family. Minimize exposure to UV radiation to reduce your risk of skin cancer. Safety Always wear your seat belt while driving or riding in a vehicle. Do not drive: If you have been drinking alcohol. Do not ride with someone who has been drinking. If you have been using any mind-altering substances or drugs. While texting. When you are tired or distracted. Wear a helmet and other protective equipment during sports activities. If you have firearms in your house, make sure you follow all gun safety procedures. Seek help if you have been physically or sexually abused. What's next? Go to your health care provider once a year for an annual wellness visit. Ask your health care provider how often you should have your eyes and teeth checked. Stay up to date on all vaccines. This information is not intended to replace advice given to you by your health care provider. Make sure you discuss any questions you have with your health care provider. Document Revised: 08/20/2020 Document Reviewed: 08/20/2020 Elsevier Patient Education  2024 Elsevier Inc.     Signed,   Reyes Pines, MD [facility], [facility] 03/28/24 5:34 PM       [1] No Known Allergies  "

## 2024-03-28 NOTE — Patient Instructions (Signed)
 Thank you for coming in today. No change in medications at this time. If there are any concerns on your bloodwork, I will let you know.   I think it would be worth discussing the recent imaging findings with your surgeon but I am happy to refer you locally to either a chest surgeon or plastic surgeon to evaluate the area on the side of your chest wall, and decide if any treatment would be recommended.  Just let me know with a MyChart message and I am happy to place that referral.  If you have additional questions let me know.  Take care.    Preventive Care 54-71 Years Old, Male Preventive care refers to lifestyle choices and visits with your health care provider that can promote health and wellness. Preventive care visits are also called wellness exams. What can I expect for my preventive care visit? Counseling During your preventive care visit, your health care provider may ask about your: Medical history, including: Past medical problems. Family medical history. Current health, including: Emotional well-being. Home life and relationship well-being. Sexual activity. Lifestyle, including: Alcohol, nicotine or tobacco, and drug use. Access to firearms. Diet, exercise, and sleep habits. Safety issues such as seatbelt and bike helmet use. Sunscreen use. Work and work astronomer. Physical exam Your health care provider may check your: Height and weight. These may be used to calculate your BMI (body mass index). BMI is a measurement that tells if you are at a healthy weight. Waist circumference. This measures the distance around your waistline. This measurement also tells if you are at a healthy weight and may help predict your risk of certain diseases, such as type 2 diabetes and high blood pressure. Heart rate and blood pressure. Body temperature. Skin for abnormal spots. What immunizations do I need?  Vaccines are usually given at various ages, according to a schedule. Your health  care provider will recommend vaccines for you based on your age, medical history, and lifestyle or other factors, such as travel or where you work. What tests do I need? Screening Your health care provider may recommend screening tests for certain conditions. This may include: Lipid and cholesterol levels. Diabetes screening. This is done by checking your blood sugar (glucose) after you have not eaten for a while (fasting). Hepatitis B test. Hepatitis C test. HIV (human immunodeficiency virus) test. STI (sexually transmitted infection) testing, if you are at risk. Talk with your health care provider about your test results, treatment options, and if necessary, the need for more tests. Follow these instructions at home: Eating and drinking  Eat a healthy diet that includes fresh fruits and vegetables, whole grains, lean protein, and low-fat dairy products. Drink enough fluid to keep your urine pale yellow. Take vitamin and mineral supplements as recommended by your health care provider. Do not drink alcohol if your health care provider tells you not to drink. If you drink alcohol: Limit how much you have to 0-2 drinks a day. Know how much alcohol is in your drink. In the [place] one drink equals one 12 oz bottle of beer (355 mL), one 5 oz glass of wine (148 mL), or one 1 oz glass of hard liquor (44 mL). Lifestyle Brush your teeth every morning and night with fluoride toothpaste. Floss one time each day. Exercise for at least 30 minutes 5 or more days each week. Do not use any products that contain nicotine or tobacco. These products include cigarettes, chewing tobacco, and vaping devices, such as e-cigarettes. If  you need help quitting, ask your health care provider. Do not use drugs. If you are sexually active, practice safe sex. Use a condom or other form of protection to prevent STIs. Find healthy ways to manage stress, such as: Meditation, yoga, or listening to  music. Journaling. Talking to a trusted person. Spending time with friends and family. Minimize exposure to UV radiation to reduce your risk of skin cancer. Safety Always wear your seat belt while driving or riding in a vehicle. Do not drive: If you have been drinking alcohol. Do not ride with someone who has been drinking. If you have been using any mind-altering substances or drugs. While texting. When you are tired or distracted. Wear a helmet and other protective equipment during sports activities. If you have firearms in your house, make sure you follow all gun safety procedures. Seek help if you have been physically or sexually abused. What's next? Go to your health care provider once a year for an annual wellness visit. Ask your health care provider how often you should have your eyes and teeth checked. Stay up to date on all vaccines. This information is not intended to replace advice given to you by your health care provider. Make sure you discuss any questions you have with your health care provider. Document Revised: 08/20/2020 Document Reviewed: 08/20/2020 Elsevier Patient Education  2024 [employer].

## 2024-03-29 LAB — SYPHILIS: RPR W/REFLEX TO RPR TITER AND TREPONEMAL ANTIBODIES, TRADITIONAL SCREENING AND DIAGNOSIS ALGORITHM: RPR Ser Ql: NONREACTIVE

## 2024-03-29 LAB — LIPID PANEL
Cholesterol: 137 mg/dL
HDL: 51 mg/dL
LDL Cholesterol (Calc): 76 mg/dL
Non-HDL Cholesterol (Calc): 86 mg/dL
Total CHOL/HDL Ratio: 2.7 (calc)
Triglycerides: 36 mg/dL

## 2024-03-29 LAB — HIV ANTIBODY (ROUTINE TESTING W REFLEX)
HIV 1&2 Ab, 4th Generation: NONREACTIVE
HIV FINAL INTERPRETATION: NEGATIVE

## 2024-03-29 NOTE — Progress Notes (Signed)
 Called patient to schedule his appt. Left message on voice mail.

## 2024-03-29 NOTE — Progress Notes (Signed)
 LM to call back so we can schedule that

## 2024-03-30 ENCOUNTER — Ambulatory Visit: Payer: Self-pay | Admitting: Family Medicine

## 2024-03-30 ENCOUNTER — Telehealth: Payer: Self-pay

## 2024-03-30 LAB — URINE CYTOLOGY ANCILLARY ONLY
Chlamydia: NEGATIVE
Comment: NEGATIVE
Comment: NEGATIVE
Comment: NORMAL
Neisseria Gonorrhea: NEGATIVE
Trichomonas: NEGATIVE

## 2024-03-30 NOTE — Telephone Encounter (Unsigned)
 Copied from CRM #[id number]. Topic: Appointments - Scheduling Inquiry for Clinic >> Mar 30, 2024  2:54 PM Deleta RAMAN wrote: Reason for CRM: patient would like for someone to give him a call regarding hpv vaccine scheduling. Would like to schedule in a couple of weeks

## 2024-03-30 NOTE — Telephone Encounter (Signed)
 Called patient and [facility] to schedule a nurse visit for HPV vaccine.   Order: 2nd HPV 9-valent vaccine,Recombinat  then schedule third in 4 months.

## 2024-03-30 NOTE — Telephone Encounter (Signed)
 E2C2 could have scheduled but did not, called patient to schedule no answer, LM to call back so we can schedule this

## 2024-04-02 NOTE — Telephone Encounter (Signed)
 Called patient and [facility]. Patient needs to schedule nurse visit for HPV vaccine. Okay to schedule if patient calls back

## 2024-04-05 NOTE — Progress Notes (Signed)
 Called patient in regards to lab visit and to schedule nurse visit. Patient has been contact on another encounter 3 times to schedule. If patient calls back please relay lab results and schedule HPV vaccine. Thank you

## 2024-09-24 ENCOUNTER — Ambulatory Visit: Admitting: Family Medicine
